# Patient Record
Sex: Female | Born: 1980 | Race: Black or African American | Hispanic: No | Marital: Married | Smoking: Never smoker
Health system: Southern US, Community
[De-identification: ages and names within clinical notes are randomized; demographics above are authoritative.]

## PROBLEM LIST (undated history)

## (undated) DIAGNOSIS — Z789 Other specified health status: Secondary | ICD-10-CM

## (undated) DIAGNOSIS — R87629 Unspecified abnormal cytological findings in specimens from vagina: Secondary | ICD-10-CM

## (undated) HISTORY — PX: NO PAST SURGERIES: SHX2092

## (undated) HISTORY — DX: Unspecified abnormal cytological findings in specimens from vagina: R87.629

---

## 2020-09-29 LAB — OB RESULTS CONSOLE GC/CHLAMYDIA: Chlamydia: NEGATIVE

## 2020-10-30 LAB — CBC AND DIFFERENTIAL
HCT: 42 (ref 36–46)
Hemoglobin: 12.9 (ref 12.0–16.0)
Platelets: 200 (ref 150–399)

## 2020-10-30 LAB — HEPATITIS B SURFACE ANTIGEN: Hepatitis B Surface Ag: NEGATIVE

## 2020-10-30 LAB — OB RESULTS CONSOLE GC/CHLAMYDIA: Gonorrhea: NEGATIVE

## 2020-10-30 LAB — OB RESULTS CONSOLE ABO/RH: RH Type: POSITIVE

## 2020-10-30 LAB — HM HEPATITIS C SCREENING LAB: HM Hepatitis Screen: NEGATIVE

## 2020-10-30 LAB — OB RESULTS CONSOLE ANTIBODY SCREEN: Antibody Screen: NEGATIVE

## 2020-10-30 LAB — HIV ANTIBODY (ROUTINE TESTING W REFLEX): HIV 1&2 Ab, 4th Generation: NONREACTIVE

## 2020-12-17 ENCOUNTER — Other Ambulatory Visit: Payer: Self-pay

## 2020-12-17 ENCOUNTER — Encounter (HOSPITAL_COMMUNITY): Payer: Self-pay | Admitting: Obstetrics and Gynecology

## 2020-12-17 ENCOUNTER — Inpatient Hospital Stay (HOSPITAL_COMMUNITY)
Admission: AD | Admit: 2020-12-17 | Discharge: 2020-12-17 | Disposition: A | Discharge status: Home / Self Care | Payer: Medicaid Other | Attending: Obstetrics and Gynecology | Admitting: Obstetrics and Gynecology

## 2020-12-17 DIAGNOSIS — O09512 Supervision of elderly primigravida, second trimester: Secondary | ICD-10-CM

## 2020-12-17 DIAGNOSIS — O368120 Decreased fetal movements, second trimester, not applicable or unspecified: Secondary | ICD-10-CM | POA: Diagnosis not present

## 2020-12-17 DIAGNOSIS — O09522 Supervision of elderly multigravida, second trimester: Secondary | ICD-10-CM | POA: Diagnosis not present

## 2020-12-17 DIAGNOSIS — Z3A22 22 weeks gestation of pregnancy: Secondary | ICD-10-CM | POA: Diagnosis not present

## 2020-12-17 HISTORY — DX: Other specified health status: Z78.9

## 2020-12-17 NOTE — Discharge Instructions (Signed)
Prenatal Care Providers           [facility]  [address] [phone]  [facility]   [address]  [phone]  [facility] [place]       [address] [phone]            [facility] @ [place]     [address] [phone]          [facility] [place]   [address] [phone]  [facility]  [address] [phone]     [facility]  [address]   [phone]     [facility]  Phone: [phone]  [facility]  Phone: [phone]  [facility] Phone: [phone]  [facility] Phone: [phone]  [facility] Phone: [phone]  [facility] Phone: [phone]  [facility]  Phone: [phone]  

## 2020-12-17 NOTE — MAU Note (Signed)
Has not felt the baby move at all today.  Was moving yesterday.  Usually if she pushes on her stomach, he will push back, not doing that today either. No pain, no bleeding or leaking.

## 2020-12-17 NOTE — MAU Provider Note (Signed)
Chief Complaint:  Decreased Fetal Movement   Event Date/Time   First Provider Initiated Contact with Patient 12/17/20 1525      HPI: Julie Montoya is a 40 y.o. G4P3003 at [redacted]w[redacted]d who presents to maternity admissions reporting she has not felt any fetal movement today. She usually feels movement every day but even when pushing on her belly or eating/drinking something, has not felt any movement today. She recently moved here from [place] and plans to establish care with [facility] but the office is waiting on records before making her appointment.   She denies any pain, vaginal bleeding, leaking fluid, or other concerns.   HPI  Past Medical History: Past Medical History:  Diagnosis Date  . Medical history non-contributory     Past obstetric history: OB History  Gravida Para Term Preterm AB Living  4 3 3     3   SAB IAB Ectopic Multiple Live Births          3    # Outcome Date GA Lbr Len/2nd Weight Sex Delivery Anes PTL Lv  4 Current           3 Term 03/19/06     Vag-Spont     2 Term 04/17/04     Vag-Spont     1 Term 03/14/03     Vag-Spont       Past Surgical History: Past Surgical History:  Procedure Laterality Date  . NO PAST SURGERIES      Family History: No family history on file.  Social History: Social History   Tobacco Use  . Smoking status: Never  . Smokeless tobacco: Never  Vaping Use  . Vaping Use: Never used  Substance Use Topics  . Alcohol use: Not Currently  . Drug use: Never    Allergies: No Known Allergies  Meds:  Medications Prior to Admission  Medication Sig Dispense Refill Last Dose  . Prenatal Vit-Fe Fumarate-FA (PRENATAL MULTIVITAMIN) TABS tablet Take 1 tablet by mouth daily at 12 noon.   12/16/2020    ROS:  Review of Systems  Constitutional:  Negative for chills, fatigue and fever.  Respiratory:  Negative for shortness of breath.   Cardiovascular:  Negative for chest pain.  Genitourinary:  Negative for difficulty urinating, dysuria, flank  pain, pelvic pain, vaginal bleeding, vaginal discharge and vaginal pain.  Neurological:  Negative for dizziness and headaches.  Psychiatric/Behavioral: Negative.      I have reviewed patient's Past Medical Hx, Surgical Hx, Family Hx, Social Hx, medications and allergies.   Physical Exam  Patient Vitals for the past 24 hrs:  BP Temp Temp src Pulse Resp SpO2 Height Weight  12/17/20 1454 116/69 -- -- 94 -- -- -- --  12/17/20 1438 115/67 98.6 F (37 C) Oral 95 18 100 % 5\' 7"  (1.702 m) 94.6 kg   Constitutional: Well-developed, well-nourished female in no acute distress.  Cardiovascular: normal rate Respiratory: normal effort GI: Abd soft, non-tender, gravid appropriate for gestational age.  MS: Extremities nontender, no edema, normal ROM Neurologic: Alert and oriented x 4.  GU: Neg CVAT.  PELVIC EXAM: Deferred     FHT:  154 by doppler   Labs: No results found for this or any previous visit (from the past 24 hour(s)).    Imaging:  No results found.  MAU Course/MDM: Orders Placed This Encounter  Procedures  . Discharge patient     No orders of the defined types were placed in this encounter.    FHT wnl Reassurance provided  to pt, discussed expected fetal movements based on gestational age Return to MAU for emergencies Records requested today in MAU, pt to follow up with [facility] to make appointment  Assessment: 1. Decreased fetal movements in second trimester, single or unspecified fetus   2. Primigravida of advanced maternal age in second trimester   3. [redacted] weeks gestation of pregnancy     Plan: Discharge home Labor precautions and fetal kick counts  Allergies as of 12/17/2020   No Known Allergies      Medication List     TAKE these medications    prenatal multivitamin Tabs tablet Take 1 tablet by mouth daily at 12 noon.        Sharen Counter Certified Nurse-Midwife 12/17/2020 3:37 PM

## 2021-01-11 DIAGNOSIS — O0990 Supervision of high risk pregnancy, unspecified, unspecified trimester: Secondary | ICD-10-CM | POA: Insufficient documentation

## 2021-01-13 ENCOUNTER — Ambulatory Visit (INDEPENDENT_AMBULATORY_CARE_PROVIDER_SITE_OTHER): Payer: Medicaid Other | Admitting: Obstetrics & Gynecology

## 2021-01-13 ENCOUNTER — Other Ambulatory Visit: Payer: Self-pay

## 2021-01-13 VITALS — BP 112/73 | HR 87 | Wt 210.0 lb

## 2021-01-13 DIAGNOSIS — O09523 Supervision of elderly multigravida, third trimester: Secondary | ICD-10-CM

## 2021-01-13 DIAGNOSIS — Z3480 Encounter for supervision of other normal pregnancy, unspecified trimester: Secondary | ICD-10-CM | POA: Diagnosis not present

## 2021-01-13 DIAGNOSIS — Z3A26 26 weeks gestation of pregnancy: Secondary | ICD-10-CM | POA: Diagnosis not present

## 2021-01-13 DIAGNOSIS — O0990 Supervision of high risk pregnancy, unspecified, unspecified trimester: Secondary | ICD-10-CM

## 2021-01-13 MED ORDER — GOJJI WEIGHT SCALE MISC: 1.0000 | 0 refills | Status: DC

## 2021-01-13 NOTE — Progress Notes (Signed)
  Subjective:Moved here from [place] on 11/01/2020    Julie Montoya is a [id number] [redacted]w[redacted]d being seen today for her first obstetrical visit.  Her obstetrical history is significant for advanced maternal age and lapse in [facility] . Patient does intend to breast feed. Pregnancy history fully reviewed.  Patient reports no complaints.  Vitals:   01/13/21 1531  BP: 112/73  Pulse: 87  Weight: 210 lb (95.3 kg)    HISTORY: OB History  Gravida Para Term Preterm AB Living  4 3 3     3   SAB IAB Ectopic Multiple Live Births          3    # Outcome Date GA Lbr Len/2nd Weight Sex Delivery Anes PTL Lv  4 Current           3 Term 03/19/06     Vag-Spont     2 Term 04/17/04     Vag-Spont     1 Term 03/14/03     Vag-Spont      Past Medical History:  Diagnosis Date   Medical history non-contributory    Past Surgical History:  Procedure Laterality Date   NO PAST SURGERIES     No family history on file.   Exam    Uterus:   27 cm  Pelvic Exam: deferred   Perineum:    Vulva:    Vagina:     pH:    Cervix:    Adnexa:    Bony Pelvis:   System: Breast:  normal appearance, no masses or tenderness   Skin: normal coloration and turgor, no rashes    Neurologic: oriented, normal mood   Extremities: normal strength, tone, and muscle mass   HEENT PERRLA and extra ocular movement intact   Mouth/Teeth    Neck supple   Cardiovascular: regular rate and rhythm   Respiratory:  appears well, vitals normal, no respiratory distress, acyanotic, normal RR   Abdomen: gravid   Urinary:       Assessment:    Pregnancy: 05/12/03 Patient Active Problem List   Diagnosis Date Noted   Supervision of high risk pregnancy, antepartum 01/11/2021    AMA    Plan:     Initial labs drawn.abstracted Prenatal vitamins. Problem list reviewed and updated. Genetic Screening discussed : results reviewed.  Ultrasound discussed; fetal survey: results reviewed.  Follow up in 2 weeks. 50% of 30 min visit spent on  counseling and coordination of care.  F/u growth scan  2 hr GTT  13/09/2020 01/13/2021

## 2021-01-13 NOTE — Progress Notes (Signed)
NOB Xfer, declined FLU vaccine. BP Cuff and Scale  sent to [facility].  Babyscripts sent.

## 2021-01-26 ENCOUNTER — Other Ambulatory Visit: Payer: Self-pay

## 2021-01-26 ENCOUNTER — Other Ambulatory Visit: Payer: Medicaid Other

## 2021-01-26 ENCOUNTER — Ambulatory Visit (INDEPENDENT_AMBULATORY_CARE_PROVIDER_SITE_OTHER): Payer: Medicaid Other | Admitting: Obstetrics & Gynecology

## 2021-01-26 ENCOUNTER — Encounter: Payer: Self-pay | Admitting: Obstetrics & Gynecology

## 2021-01-26 VITALS — BP 110/71 | HR 80 | Wt 215.0 lb

## 2021-01-26 DIAGNOSIS — O0993 Supervision of high risk pregnancy, unspecified, third trimester: Secondary | ICD-10-CM

## 2021-01-26 DIAGNOSIS — Z23 Encounter for immunization: Secondary | ICD-10-CM

## 2021-01-26 DIAGNOSIS — O0990 Supervision of high risk pregnancy, unspecified, unspecified trimester: Secondary | ICD-10-CM

## 2021-01-26 DIAGNOSIS — Z3A28 28 weeks gestation of pregnancy: Secondary | ICD-10-CM

## 2021-01-26 DIAGNOSIS — O09523 Supervision of elderly multigravida, third trimester: Secondary | ICD-10-CM | POA: Insufficient documentation

## 2021-01-26 NOTE — Progress Notes (Signed)
   PRENATAL VISIT NOTE  Subjective:  Julie Montoya is a 40 y.o. G4P3003 at [redacted]w[redacted]d being seen today for ongoing prenatal care.  She is currently monitored for the following issues for this high-risk pregnancy and has Supervision of high risk pregnancy, antepartum and AMA (advanced maternal age) multigravida 35+, third trimester on their problem list.  Patient reports no complaints.  Contractions: Not present. Vag. Bleeding: None.  Movement: Present. Denies leaking of fluid.   The following portions of the patient's history were reviewed and updated as appropriate: allergies, current medications, past family history, past medical history, past social history, past surgical history and problem list.   Objective:   Vitals:   01/26/21 0909  BP: 110/71  Pulse: 80  Weight: 215 lb (97.5 kg)    Fetal Status: Fetal Heart Rate (bpm): 143   Movement: Present     General:  Alert, oriented and cooperative. Patient is in no acute distress.  Skin: Skin is warm and dry. No rash noted.   Cardiovascular: Normal heart rate noted  Respiratory: Normal respiratory effort, no problems with respiration noted  Abdomen: Soft, gravid, appropriate for gestational age.  Pain/Pressure: Absent     Pelvic: Cervical exam deferred        Extremities: Normal range of motion.  Edema: None  Mental Status: Normal mood and affect. Normal behavior. Normal judgment and thought content.   Assessment and Plan:  Pregnancy: G4P3003 at [redacted]w[redacted]d 1. Supervision of high risk pregnancy, antepartum Routine GTT - Glucose Tolerance, 2 Hours w/1 Hour - CBC - RPR - HIV Antibody (routine testing w rflx) - Tdap vaccine greater than or equal to 7yo IM  2. [redacted] weeks gestation of pregnancy  - Glucose Tolerance, 2 Hours w/1 Hour - CBC - RPR - HIV Antibody (routine testing w rflx) - Tdap vaccine greater than or equal to 7yo IM  3. AMA (advanced maternal age) multigravida 35+, third trimester   Preterm labor symptoms and general  obstetric precautions including but not limited to vaginal bleeding, contractions, leaking of fluid and fetal movement were reviewed in detail with the patient. Please refer to After Visit Summary for other counseling recommendations.   Return in about 2 weeks (around 02/09/2021).  Future Appointments  Date Time Provider Department Center  01/26/2021  9:55 AM Adam Phenix, MD [facility] None  02/09/2021  7:30 AM [facility] NURSE [facility]  02/09/2021  7:45 AM [facility] US4 [facility]    Scheryl Darter, MD

## 2021-01-26 NOTE — Progress Notes (Signed)
+   Fetal movement. No complaints.  

## 2021-01-27 LAB — HIV ANTIBODY (ROUTINE TESTING W REFLEX): HIV Screen 4th Generation wRfx: NONREACTIVE

## 2021-01-27 LAB — GLUCOSE TOLERANCE, 2 HOURS W/ 1HR
Glucose, 1 hour: 197 mg/dL — ABNORMAL HIGH (ref 70–179)
Glucose, 2 hour: 135 mg/dL (ref 70–152)
Glucose, Fasting: 86 mg/dL (ref 70–91)

## 2021-01-27 LAB — CBC
Hematocrit: 36.9 % (ref 34.0–46.6)
Hemoglobin: 12.9 g/dL (ref 11.1–15.9)
MCH: 32.6 pg (ref 26.6–33.0)
MCHC: 35 g/dL (ref 31.5–35.7)
MCV: 93 fL (ref 79–97)
Platelets: 193 10*3/uL (ref 150–450)
RBC: 3.96 x10E6/uL (ref 3.77–5.28)
RDW: 12.1 % (ref 11.7–15.4)
WBC: 9 10*3/uL (ref 3.4–10.8)

## 2021-01-27 LAB — RPR: RPR Ser Ql: NONREACTIVE

## 2021-02-01 ENCOUNTER — Inpatient Hospital Stay (HOSPITAL_COMMUNITY)
Admission: AD | Admit: 2021-02-01 | Discharge: 2021-02-01 | Disposition: A | Discharge status: Home / Self Care | Payer: Medicaid Other | Attending: Obstetrics and Gynecology | Admitting: Obstetrics and Gynecology

## 2021-02-01 ENCOUNTER — Other Ambulatory Visit: Payer: Self-pay

## 2021-02-01 DIAGNOSIS — O09523 Supervision of elderly multigravida, third trimester: Secondary | ICD-10-CM | POA: Diagnosis not present

## 2021-02-01 DIAGNOSIS — O26893 Other specified pregnancy related conditions, third trimester: Secondary | ICD-10-CM | POA: Insufficient documentation

## 2021-02-01 DIAGNOSIS — Z3A29 29 weeks gestation of pregnancy: Secondary | ICD-10-CM | POA: Diagnosis not present

## 2021-02-01 DIAGNOSIS — R300 Dysuria: Secondary | ICD-10-CM | POA: Diagnosis present

## 2021-02-01 LAB — URINALYSIS, ROUTINE W REFLEX MICROSCOPIC
Bacteria, UA: NONE SEEN
Bilirubin Urine: NEGATIVE
Glucose, UA: NEGATIVE mg/dL
Hgb urine dipstick: NEGATIVE
Ketones, ur: NEGATIVE mg/dL
Leukocytes,Ua: NEGATIVE
Nitrite: NEGATIVE
Protein, ur: 30 mg/dL — AB
Specific Gravity, Urine: 1.028 (ref 1.005–1.030)
pH: 5 (ref 5.0–8.0)

## 2021-02-01 NOTE — MAU Note (Signed)
Presents with pain and stinging with urination.  Denies frequency & urgency.   Endorses +FM.  Denies VB or LOF.

## 2021-02-01 NOTE — MAU Provider Note (Signed)
Event Date/Time   First Provider Initiated Contact with Patient 02/01/21 1545      S Ms. Julie Montoya is a 40 y.o. [phone] patient who presents to MAU today with complaint of dysuria that started yesterday that has been staying about the same. No decreased fetal movement, nausea, vomiting, fevers, chills. Denies flank pain.   O BP (!) 106/58 (BP Location: Right Arm)   Pulse 83   Temp 97.9 F (36.6 C) (Oral)   Resp 19   Ht 5\' 7"  (1.702 m)   Wt 98.2 kg   LMP 07/12/2020 (Exact Date)   SpO2 99%   BMI 33.92 kg/m  Physical Exam Constitutional:      Appearance: Normal appearance.  Cardiovascular:     Rate and Rhythm: Normal rate and regular rhythm.     Pulses: Normal pulses.     Heart sounds: Normal heart sounds.  Pulmonary:     Effort: Pulmonary effort is normal.     Breath sounds: Normal breath sounds.  Abdominal:     General: Abdomen is flat. There is no distension.     Palpations: Abdomen is soft.     Tenderness: There is no abdominal tenderness. There is no right CVA tenderness or left CVA tenderness.  Skin:    General: Skin is warm and dry.     Capillary Refill: Capillary refill takes less than 2 seconds.  Neurological:     General: No focal deficit present.     Mental Status: She is alert.  Psychiatric:        Mood and Affect: Mood normal.        Behavior: Behavior normal.        Thought Content: Thought content normal.        Judgment: Judgment normal.   Results for orders placed or performed during the hospital encounter of 02/01/21 (from the past 24 hour(s))  Urinalysis, Routine w reflex microscopic Urine, Clean Catch     Status: Abnormal   Collection Time: 02/01/21  3:45 PM  Result Value Ref Range   Color, Urine YELLOW YELLOW   APPearance HAZY (A) CLEAR   Specific Gravity, Urine 1.028 1.005 - 1.030   pH 5.0 5.0 - 8.0   Glucose, UA NEGATIVE NEGATIVE mg/dL   Hgb urine dipstick NEGATIVE NEGATIVE   Bilirubin Urine NEGATIVE NEGATIVE   Ketones, ur NEGATIVE  NEGATIVE mg/dL   Protein, ur 30 (A) NEGATIVE mg/dL   Nitrite NEGATIVE NEGATIVE   Leukocytes,Ua NEGATIVE NEGATIVE   RBC / HPF 0-5 0 - 5 RBC/hpf   WBC, UA 0-5 0 - 5 WBC/hpf   Bacteria, UA NONE SEEN NONE SEEN   Squamous Epithelial / LPF 0-5 0 - 5   Mucus PRESENT    Ca Oxalate Crys, UA PRESENT      A Medical screening exam complete 1. [redacted] weeks gestation of pregnancy   2. Dysuria      P Discharge from MAU in stable condition Send urine for culture.  Encourage fluids, cranberry juice, Azo. Patient may return to MAU as needed   02/03/21, DO 02/01/2021 4:46 PM

## 2021-02-02 ENCOUNTER — Other Ambulatory Visit: Payer: Self-pay

## 2021-02-02 DIAGNOSIS — O24419 Gestational diabetes mellitus in pregnancy, unspecified control: Secondary | ICD-10-CM

## 2021-02-02 LAB — URINE CULTURE: Culture: 60000 — AB

## 2021-02-02 MED ORDER — ACCU-CHEK SOFTCLIX LANCETS MISC: 12 refills | Status: DC

## 2021-02-02 MED ORDER — ACCU-CHEK GUIDE VI STRP: ORAL_STRIP | 12 refills | Status: DC

## 2021-02-02 MED ORDER — ACCU-CHEK GUIDE W/DEVICE KIT: 1.0000 | PACK | Freq: Four times a day (QID) | 0 refills | Status: DC

## 2021-02-03 ENCOUNTER — Encounter: Payer: Medicaid Other | Attending: Obstetrics & Gynecology | Admitting: Registered"

## 2021-02-03 ENCOUNTER — Other Ambulatory Visit: Payer: Self-pay

## 2021-02-03 ENCOUNTER — Encounter: Payer: Self-pay | Admitting: Registered"

## 2021-02-03 DIAGNOSIS — O24419 Gestational diabetes mellitus in pregnancy, unspecified control: Secondary | ICD-10-CM | POA: Insufficient documentation

## 2021-02-03 NOTE — Progress Notes (Signed)
Patient was seen on 02/03/21 for Gestational Diabetes self-management class at [facility]. The following learning objectives were met by the patient during this course:  States the definition of Gestational Diabetes States why dietary management is important in controlling blood glucose Describes the effects each nutrient has on blood glucose levels Demonstrates ability to create a balanced meal plan Demonstrates carbohydrate counting  States when to check blood glucose levels Demonstrates proper blood glucose monitoring techniques States the effect of stress and exercise on blood glucose levels States the importance of limiting caffeine and abstaining from alcohol and smoking  Blood glucose monitor given: Accu-chek Guide Me Lot #[id number] Exp: 04/16/2022 CBG: 93 mg/dL   Patient instructed to monitor glucose levels: FBS: 60 - <95; 1 hour: <140; 2 hour: <120  Patient received handouts: Nutrition Diabetes and Pregnancy, including carb counting list  Patient will be seen for follow-up as needed.

## 2021-02-05 ENCOUNTER — Ambulatory Visit: Payer: Medicaid Other

## 2021-02-09 ENCOUNTER — Other Ambulatory Visit: Payer: Self-pay | Admitting: *Deleted

## 2021-02-09 ENCOUNTER — Encounter: Payer: Self-pay | Admitting: *Deleted

## 2021-02-09 ENCOUNTER — Ambulatory Visit: Payer: Medicaid Other | Attending: Obstetrics & Gynecology

## 2021-02-09 ENCOUNTER — Ambulatory Visit (HOSPITAL_BASED_OUTPATIENT_CLINIC_OR_DEPARTMENT_OTHER): Payer: Medicaid Other | Admitting: Obstetrics and Gynecology

## 2021-02-09 ENCOUNTER — Other Ambulatory Visit: Payer: Self-pay

## 2021-02-09 ENCOUNTER — Ambulatory Visit: Payer: Medicaid Other | Admitting: *Deleted

## 2021-02-09 VITALS — BP 101/62 | HR 79

## 2021-02-09 DIAGNOSIS — Z3A30 30 weeks gestation of pregnancy: Secondary | ICD-10-CM

## 2021-02-09 DIAGNOSIS — O09523 Supervision of elderly multigravida, third trimester: Secondary | ICD-10-CM | POA: Diagnosis present

## 2021-02-09 DIAGNOSIS — O0933 Supervision of pregnancy with insufficient antenatal care, third trimester: Secondary | ICD-10-CM

## 2021-02-09 DIAGNOSIS — O24419 Gestational diabetes mellitus in pregnancy, unspecified control: Secondary | ICD-10-CM

## 2021-02-09 DIAGNOSIS — O0990 Supervision of high risk pregnancy, unspecified, unspecified trimester: Secondary | ICD-10-CM | POA: Diagnosis present

## 2021-02-09 NOTE — Progress Notes (Signed)
Maternal-Fetal Medicine   Name: Julie Montoya DOB: 10-Feb-1981 MRN: [id number] Referring Provider: Scheryl Darter, MD  I had the pleasure of seeing [name] [facility] at [facility]. G4 P3003 at 30w 2d gestation and is here for ultrasound evaluation. She recently moved from [place], [place] where she initiated her prenatal care.    Advanced maternal age.  On cell free fetal DNA screening, the risks of fetal aneuploidies are not increased.  Patient has not had fetal anatomical survey ultrasound.  She has a recent diagnosis of gestational diabetes.  Patient had consultation with our diabetic educator.  She is yet to check her blood glucose regularly.  Obstetric history significant for 3 term vaginal deliveries.  She reports no chronic medical conditions.  Ultrasound Fetal growth is appropriate for gestational age.  Amniotic fluid is normal good fetal activity seen.  No obvious fetal structural defects are seen.  Fetal anatomical survey appears normal but limited by advanced gestational age.  Gestational diabetes I explained the diagnosis of gestational diabetes.  I emphasized the importance of good blood glucose control to prevent adverse fetal or neonatal outcomes.  I discussed blood glucose normal values. I encouraged her to check her blood glucose regularly. Possible complications of gestational diabetes include fetal macrosomia, shoulder dystocia and birth injuries, stillbirth (in poorly controlled diabetes) and neonatal respiratory syndrome and other complications.  In about 85% of cases, gestational diabetes is well controlled by diet alone.  Exercise reduces the need for insulin.  Medical treatment includes oral hypoglycemics or insulin. If patient requires insulin or oral hypoglycemics, we recommend weekly antenatal testing from 32 weeks' gestation till delivery. Because of the possibility of suboptimal control, I recommend weekly BPP from 32 weeks' gestation  till delivery.  Timing of delivery: In well-controlled diabetes on diet, patient can be delivered at 59- or 40-weeks' gestation. Vagal delivery is not contraindicated. Type 2 diabetes develops in about 25% to 40% of women with GDM. I recommend postpartum screening with 75-g glucose load at 6 to 12 weeks after delivery.  Recommendations -An appointment was made for her to return in 2 weeks for BPP. -Weekly BPP from [redacted] weeks gestation till delivery. -Postpartum screening for type 2 diabetes.  Thank you for consultation.  If you have any questions or concerns, please contact me the [facility].  Consultation including face-to-face (more than 50%) counseling 30 minutes.

## 2021-02-12 ENCOUNTER — Other Ambulatory Visit: Payer: Self-pay

## 2021-02-12 ENCOUNTER — Ambulatory Visit (INDEPENDENT_AMBULATORY_CARE_PROVIDER_SITE_OTHER): Payer: Medicaid Other | Admitting: Obstetrics

## 2021-02-12 ENCOUNTER — Encounter: Payer: Self-pay | Admitting: Obstetrics

## 2021-02-12 VITALS — BP 109/71 | HR 84 | Wt 212.0 lb

## 2021-02-12 DIAGNOSIS — O0990 Supervision of high risk pregnancy, unspecified, unspecified trimester: Secondary | ICD-10-CM

## 2021-02-12 DIAGNOSIS — O2243 Hemorrhoids in pregnancy, third trimester: Secondary | ICD-10-CM

## 2021-02-12 DIAGNOSIS — K5901 Slow transit constipation: Secondary | ICD-10-CM

## 2021-02-12 MED ORDER — HYDROCORTISONE ACETATE 25 MG RE SUPP: 25.0000 mg | Freq: Two times a day (BID) | RECTAL | 11 refills | Status: DC

## 2021-02-12 MED ORDER — DOCUSATE SODIUM 100 MG PO CAPS: 100.0000 mg | ORAL_CAPSULE | Freq: Two times a day (BID) | ORAL | 11 refills | Status: DC

## 2021-02-12 NOTE — Progress Notes (Signed)
Pt having problems with constipation and now has hemorrhoids.

## 2021-02-12 NOTE — Progress Notes (Signed)
Subjective:  Julie Montoya is a 40 y.o. G4P3003 at [redacted]w[redacted]d being seen today for ongoing prenatal care.  She is currently monitored for the following issues for this high-risk pregnancy and has Supervision of high risk pregnancy, antepartum; AMA (advanced maternal age) multigravida 35+, third trimester; and Gestational diabetes mellitus (GDM), antepartum on their problem list.  Patient reports  constipation and hemorrhoids .  Contractions: Not present. Vag. Bleeding: None.  Movement: Present. Denies leaking of fluid.   The following portions of the patient's history were reviewed and updated as appropriate: allergies, current medications, past family history, past medical history, past social history, past surgical history and problem list. Problem list updated.  Objective:   Vitals:   02/12/21 0852  BP: 109/71  Pulse: 84  Weight: 212 lb (96.2 kg)    Fetal Status:     Movement: Present     General:  Alert, oriented and cooperative. Patient is in no acute distress.  Skin: Skin is warm and dry. No rash noted.   Cardiovascular: Normal heart rate noted  Respiratory: Normal respiratory effort, no problems with respiration noted  Abdomen: Soft, gravid, appropriate for gestational age. Pain/Pressure: Absent     Pelvic:  Cervical exam deferred        Extremities: Normal range of motion.     Mental Status: Normal mood and affect. Normal behavior. Normal judgment and thought content.   Urinalysis:      Assessment and Plan:  Pregnancy: G4P3003 at [redacted]w[redacted]d  1. Supervision of high risk pregnancy, antepartum  2. Hemorrhoids during pregnancy in third trimester Rx: - hydrocortisone (ANUSOL-HC) 25 MG suppository; Place 1 suppository (25 mg total) rectally 2 (two) times daily.  Dispense: 24 suppository; Refill: 11  3. Constipation by delayed colonic transit Rx: - docusate sodium (COLACE) 100 MG capsule; Take 1 capsule (100 mg total) by mouth 2 (two) times daily.  Dispense: 60 capsule; Refill:  11    Preterm labor symptoms and general obstetric precautions including but not limited to vaginal bleeding, contractions, leaking of fluid and fetal movement were reviewed in detail with the patient. Please refer to After Visit Summary for other counseling recommendations.   Return in about 2 weeks (around 02/26/2021) for ROB.   Brock Bad, MD  02/12/21

## 2021-02-23 ENCOUNTER — Ambulatory Visit (INDEPENDENT_AMBULATORY_CARE_PROVIDER_SITE_OTHER): Payer: Medicaid Other | Admitting: Obstetrics and Gynecology

## 2021-02-23 ENCOUNTER — Other Ambulatory Visit: Payer: Self-pay

## 2021-02-23 VITALS — BP 107/70 | HR 78 | Wt 220.7 lb

## 2021-02-23 DIAGNOSIS — Z3A32 32 weeks gestation of pregnancy: Secondary | ICD-10-CM | POA: Insufficient documentation

## 2021-02-23 DIAGNOSIS — O0990 Supervision of high risk pregnancy, unspecified, unspecified trimester: Secondary | ICD-10-CM

## 2021-02-23 DIAGNOSIS — O09523 Supervision of elderly multigravida, third trimester: Secondary | ICD-10-CM

## 2021-02-23 DIAGNOSIS — O24410 Gestational diabetes mellitus in pregnancy, diet controlled: Secondary | ICD-10-CM

## 2021-02-23 NOTE — Progress Notes (Signed)
° °  PRENATAL VISIT NOTE  Subjective:  Julie Montoya is a 40 y.o. G4P3003 at [redacted]w[redacted]d being seen today for ongoing prenatal care.  She is currently monitored for the following issues for this high-risk pregnancy and has Supervision of high risk pregnancy, antepartum; AMA (advanced maternal age) multigravida 35+, third trimester; Gestational diabetes mellitus (GDM), antepartum; and [redacted] weeks gestation of pregnancy on their problem list.  Patient doing well with no acute concerns today. She reports no complaints.  Contractions: Not present. Vag. Bleeding: None.  Movement: Present. Denies leaking of fluid.   The following portions of the patient's history were reviewed and updated as appropriate: allergies, current medications, past family history, past medical history, past social history, past surgical history and problem list. Problem list updated.  Objective:   Vitals:   02/23/21 1546  BP: 107/70  Pulse: 78  Weight: 220 lb 11.2 oz (100.1 kg)    Fetal Status: Fetal Heart Rate (bpm): 154 Fundal Height: 33 cm Movement: Present     General:  Alert, oriented and cooperative. Patient is in no acute distress.  Skin: Skin is warm and dry. No rash noted.   Cardiovascular: Normal heart rate noted  Respiratory: Normal respiratory effort, no problems with respiration noted  Abdomen: Soft, gravid, appropriate for gestational age.  Pain/Pressure: Absent     Pelvic: Cervical exam deferred        Extremities: Normal range of motion.     Mental Status:  Normal mood and affect. Normal behavior. Normal judgment and thought content.   Assessment and Plan:  Pregnancy: G4P3003 at [redacted]w[redacted]d  1. [redacted] weeks gestation of pregnancy   2. Diet controlled gestational diabetes mellitus (GDM), antepartum Continue GDM diet  3. Supervision of high risk pregnancy, antepartum Continue routine prenatal care Discussed postpartum contraception, pt leaning toward depo provera, but is considering nexplanon or IUD  4. AMA  (advanced maternal age) multigravida 35+, third trimester   Preterm labor symptoms and general obstetric precautions including but not limited to vaginal bleeding, contractions, leaking of fluid and fetal movement were reviewed in detail with the patient.  Please refer to After Visit Summary for other counseling recommendations.   Return in about 2 weeks (around 03/09/2021) for [facility], in person.   Mariel Aloe, MD Faculty Attending [facility] for [facility]

## 2021-03-07 NOTE — L&D Delivery Note (Signed)
LABOR COURSE   Delivery Note Called to room and patient was complete and pushing. Head delivered [phone]. No nuchal cord present. Shoulder and body delivered in usual fashion. At [phone] a viable female was delivered via Vaginal, Spontaneous (Presentation: ROA). Infant with spontaneous cry, placed on mother's abdomen, dried and stimulated. Cord clamped x 2 after 1-minute delay, and cut by father, Cedric. Cord blood drawn. Placenta delivered manually with gentle cord traction. Appears intact. Fundus firm with massage and Pitocin. Labia, perineum, vagina, and cervix inspected with 4x4, and intact.    APGAR: 8,9 ; weight: pending see delivery summary Cord: 3VC with the following complications: None     Anesthesia: Epidural Episiotomy: None Lacerations: None Suture Repair:  N/A Est. Blood Loss (mL): 600  Mom to postpartum.  Baby boy Camari to Couplet care / Skin to Skin.  Jeananne Rama, SNM 7:22 AM

## 2021-03-09 ENCOUNTER — Ambulatory Visit (INDEPENDENT_AMBULATORY_CARE_PROVIDER_SITE_OTHER): Payer: Medicaid Other | Admitting: Family Medicine

## 2021-03-09 ENCOUNTER — Other Ambulatory Visit: Payer: Self-pay

## 2021-03-09 VITALS — BP 110/71 | HR 80 | Wt 213.0 lb

## 2021-03-09 DIAGNOSIS — O0990 Supervision of high risk pregnancy, unspecified, unspecified trimester: Secondary | ICD-10-CM

## 2021-03-09 DIAGNOSIS — O09523 Supervision of elderly multigravida, third trimester: Secondary | ICD-10-CM

## 2021-03-09 DIAGNOSIS — O24410 Gestational diabetes mellitus in pregnancy, diet controlled: Secondary | ICD-10-CM

## 2021-03-09 MED ORDER — METFORMIN HCL 500 MG PO TABS: 500.0000 mg | ORAL_TABLET | Freq: Two times a day (BID) | ORAL | 2 refills | Status: DC

## 2021-03-09 NOTE — Progress Notes (Signed)
Pt states her sugars are doing well at home.

## 2021-03-09 NOTE — Progress Notes (Signed)
° °  PRENATAL VISIT NOTE  Subjective:  Julie Montoya is a 41 y.o. G4P3003 at [redacted]w[redacted]d being seen today for ongoing prenatal care.  She is currently monitored for the following issues for this high-risk pregnancy and has Supervision of high risk pregnancy, antepartum; AMA (advanced maternal age) multigravida 35+, third trimester; and Gestational diabetes mellitus (GDM), antepartum on their problem list.  Patient reports no complaints.  Contractions: Not present. Vag. Bleeding: None.  Movement: Present. Denies leaking of fluid.   The following portions of the patient's history were reviewed and updated as appropriate: allergies, current medications, past family history, past medical history, past social history, past surgical history and problem list.   Objective:   Vitals:   03/09/21 1338  BP: 110/71  Pulse: 80  Weight: 213 lb (96.6 kg)    Fetal Status: Fetal Heart Rate (bpm): 150 Fundal Height: 33 cm Movement: Present     General:  Alert, oriented and cooperative. Patient is in no acute distress.  Skin: Skin is warm and dry. No rash noted.   Cardiovascular: Normal heart rate noted  Respiratory: Normal respiratory effort, no problems with respiration noted  Abdomen: Soft, gravid, appropriate for gestational age.  Pain/Pressure: Absent     Pelvic: Cervical exam performed in the presence of a chaperone        Extremities: Normal range of motion.     Mental Status: Normal mood and affect. Normal behavior. Normal judgment and thought content.   Assessment and Plan:  Pregnancy: G4P3003 at [redacted]w[redacted]d 1. Supervision of high risk pregnancy, antepartum   2. Diet controlled gestational diabetes mellitus (GDM), antepartum Reports CBGs are always higher than 120 No log book today fastings are all > 90 Has tried smaller portions. In antenatal testing tomorrow Begin Metformin - metFORMIN (GLUCOPHAGE) 500 MG tablet; Take 1 tablet (500 mg total) by mouth 2 (two) times daily with a meal.  Dispense:  60 tablet; Refill: 2  3. AMA (advanced maternal age) multigravida 64+, third trimester LR NIPT  Preterm labor symptoms and general obstetric precautions including but not limited to vaginal bleeding, contractions, leaking of fluid and fetal movement were reviewed in detail with the patient. Please refer to After Visit Summary for other counseling recommendations.   Return in 2 weeks (on 03/23/2021).  Future Appointments  Date Time Provider [place]  03/10/2021  2:30 PM [facility] NURSE [facility]  03/10/2021  2:45 PM [facility] US5 [facility]  03/16/2021  2:30 PM [facility] NURSE [facility]  03/16/2021  2:45 PM [facility]  03/24/2021  8:35 AM Genia Del, MD [facility] None  03/24/2021  2:45 PM [facility] NURSE [facility]  03/24/2021  3:00 PM [facility] US1 [facility]    Donnamae Jude, MD

## 2021-03-10 ENCOUNTER — Ambulatory Visit: Payer: Medicaid Other | Attending: Obstetrics and Gynecology

## 2021-03-10 ENCOUNTER — Ambulatory Visit: Payer: Medicaid Other | Admitting: *Deleted

## 2021-03-10 ENCOUNTER — Other Ambulatory Visit: Payer: Self-pay | Admitting: *Deleted

## 2021-03-10 VITALS — BP 109/71 | HR 78

## 2021-03-10 DIAGNOSIS — O0933 Supervision of pregnancy with insufficient antenatal care, third trimester: Secondary | ICD-10-CM

## 2021-03-10 DIAGNOSIS — O09523 Supervision of elderly multigravida, third trimester: Secondary | ICD-10-CM

## 2021-03-10 DIAGNOSIS — O24415 Gestational diabetes mellitus in pregnancy, controlled by oral hypoglycemic drugs: Secondary | ICD-10-CM

## 2021-03-10 DIAGNOSIS — Z3A34 34 weeks gestation of pregnancy: Secondary | ICD-10-CM

## 2021-03-10 DIAGNOSIS — O24419 Gestational diabetes mellitus in pregnancy, unspecified control: Secondary | ICD-10-CM | POA: Diagnosis present

## 2021-03-16 ENCOUNTER — Ambulatory Visit: Payer: Medicaid Other | Attending: Obstetrics and Gynecology

## 2021-03-16 ENCOUNTER — Other Ambulatory Visit: Payer: Self-pay

## 2021-03-16 ENCOUNTER — Ambulatory Visit: Payer: Medicaid Other | Admitting: *Deleted

## 2021-03-16 VITALS — BP 107/70 | HR 78

## 2021-03-16 DIAGNOSIS — O24419 Gestational diabetes mellitus in pregnancy, unspecified control: Secondary | ICD-10-CM | POA: Insufficient documentation

## 2021-03-16 DIAGNOSIS — O09523 Supervision of elderly multigravida, third trimester: Secondary | ICD-10-CM

## 2021-03-16 DIAGNOSIS — O0933 Supervision of pregnancy with insufficient antenatal care, third trimester: Secondary | ICD-10-CM | POA: Diagnosis not present

## 2021-03-16 DIAGNOSIS — Z3A35 35 weeks gestation of pregnancy: Secondary | ICD-10-CM

## 2021-03-23 ENCOUNTER — Ambulatory Visit: Payer: Medicaid Other

## 2021-03-24 ENCOUNTER — Ambulatory Visit: Payer: Medicaid Other | Attending: Obstetrics and Gynecology

## 2021-03-24 ENCOUNTER — Encounter: Payer: Self-pay | Admitting: *Deleted

## 2021-03-24 ENCOUNTER — Ambulatory Visit: Payer: Medicaid Other | Admitting: *Deleted

## 2021-03-24 ENCOUNTER — Other Ambulatory Visit (HOSPITAL_COMMUNITY)
Admission: RE | Admit: 2021-03-24 | Discharge: 2021-03-24 | Disposition: A | Discharge status: Home / Self Care | Payer: Medicaid Other | Source: Ambulatory Visit | Attending: Family Medicine | Admitting: Family Medicine

## 2021-03-24 ENCOUNTER — Other Ambulatory Visit: Payer: Self-pay

## 2021-03-24 ENCOUNTER — Ambulatory Visit (INDEPENDENT_AMBULATORY_CARE_PROVIDER_SITE_OTHER): Payer: Medicaid Other | Admitting: Family Medicine

## 2021-03-24 ENCOUNTER — Encounter: Payer: Self-pay | Admitting: Family Medicine

## 2021-03-24 VITALS — BP 108/70 | HR 78

## 2021-03-24 VITALS — BP 112/66 | HR 77 | Wt 215.0 lb

## 2021-03-24 DIAGNOSIS — O24419 Gestational diabetes mellitus in pregnancy, unspecified control: Secondary | ICD-10-CM | POA: Diagnosis present

## 2021-03-24 DIAGNOSIS — O0990 Supervision of high risk pregnancy, unspecified, unspecified trimester: Secondary | ICD-10-CM

## 2021-03-24 DIAGNOSIS — Z3A36 36 weeks gestation of pregnancy: Secondary | ICD-10-CM

## 2021-03-24 DIAGNOSIS — O0933 Supervision of pregnancy with insufficient antenatal care, third trimester: Secondary | ICD-10-CM | POA: Diagnosis not present

## 2021-03-24 DIAGNOSIS — O09523 Supervision of elderly multigravida, third trimester: Secondary | ICD-10-CM

## 2021-03-24 DIAGNOSIS — O24415 Gestational diabetes mellitus in pregnancy, controlled by oral hypoglycemic drugs: Secondary | ICD-10-CM

## 2021-03-24 NOTE — Progress Notes (Signed)
° °  PRENATAL VISIT NOTE  Subjective:  Julie Montoya is a 41 y.o. G4P3003 at [redacted]w[redacted]d being seen today for ongoing prenatal care.  She is currently monitored for the following issues for this high-risk pregnancy and has Supervision of high risk pregnancy, antepartum; AMA (advanced maternal age) multigravida 35+, third trimester; and Gestational diabetes mellitus (GDM), antepartum on their problem list.  Patient reports no complaints.  Contractions: Not present. Vag. Bleeding: None.  Movement: Present. Denies leaking of fluid.   The following portions of the patient's history were reviewed and updated as appropriate: allergies, current medications, past family history, past medical history, past social history, past surgical history and problem list.   Objective:   Vitals:   03/24/21 0900  BP: 112/66  Pulse: 77  Weight: 215 lb (97.5 kg)    Fetal Status: Fetal Heart Rate (bpm): 154 Fundal Height: 37 cm Movement: Present  General:  Alert, oriented and cooperative. Patient is in no acute distress.  Skin: Skin is warm and dry. No rash noted.   Cardiovascular: Normal heart rate noted.  Respiratory: Normal respiratory effort, no problems with respiration noted.  Abdomen: Soft, gravid, appropriate for gestational age.  Pain/Pressure: Absent.     Pelvic: Cervical exam deferred. Swabs obtained with chaperone present.   Extremities: Normal range of motion.  Edema: None.  Mental Status: Normal mood and affect. Normal behavior. Normal judgment and thought content.   Assessment and Plan:  Pregnancy: G4P3003 at [redacted]w[redacted]d  1. Supervision of high risk pregnancy, antepartum 2. [redacted] weeks gestation of pregnancy Progressing well. FH and FHT within normal limits. GBS and vaginal swab obtained today, will follow up results. Next visit in 1 week.  - Cervicovaginal ancillary only( [place]) - Culture, beta strep (group b only)  3. Gestational diabetes mellitus (GDM) controlled on oral hypoglycemic drug,  antepartum Fasting CBGs now at goal. Post-prandial CBGs still between 120-200. Taking Metformin once daily. Recommended increase to twice daily. Counseled on dietary changes to support low carbohydrate diet. Will reassess at next visit. Continue antenatal testing with MFM. Last growth 94% EFW.   4. AMA (advanced maternal age) multigravida 61+, third trimester LR NIPS.   Preterm labor symptoms and general obstetric precautions including but not limited to vaginal bleeding, contractions, leaking of fluid and fetal movement were reviewed in detail with the patient.  Please refer to After Visit Summary for other counseling recommendations.   Return in about 1 week (around 03/31/2021) for follow up HR OB visit.  Future Appointments  Date Time Provider [place]  03/24/2021  2:45 PM [facility] NURSE [facility]  03/24/2021  3:00 PM [facility] US1 [facility]  04/02/2021 12:30 PM [facility] NURSE [facility]  04/02/2021 12:45 PM [facility] US6 [facility]  04/07/2021 12:30 PM [facility] NURSE [facility]  04/07/2021 12:45 PM [facility] US4 [facility]   Margreta Journey Barrie Lyme, MD

## 2021-03-24 NOTE — Progress Notes (Signed)
ROB/GBS. BS fasting 82 and 2 hrs after 126-197. C/o suppository not working.

## 2021-03-25 LAB — CERVICOVAGINAL ANCILLARY ONLY
Chlamydia: NEGATIVE
Comment: NEGATIVE
Comment: NEGATIVE
Comment: NORMAL
Neisseria Gonorrhea: NEGATIVE
Trichomonas: NEGATIVE

## 2021-03-28 LAB — CULTURE, BETA STREP (GROUP B ONLY): Strep Gp B Culture: NEGATIVE

## 2021-04-02 ENCOUNTER — Other Ambulatory Visit: Payer: Self-pay | Admitting: Advanced Practice Midwife

## 2021-04-02 ENCOUNTER — Other Ambulatory Visit: Payer: Self-pay

## 2021-04-02 ENCOUNTER — Ambulatory Visit: Payer: Medicaid Other | Admitting: *Deleted

## 2021-04-02 ENCOUNTER — Encounter (HOSPITAL_COMMUNITY): Payer: Self-pay

## 2021-04-02 ENCOUNTER — Encounter (HOSPITAL_COMMUNITY): Payer: Self-pay | Admitting: *Deleted

## 2021-04-02 ENCOUNTER — Telehealth (HOSPITAL_COMMUNITY): Payer: Self-pay | Admitting: *Deleted

## 2021-04-02 ENCOUNTER — Ambulatory Visit (INDEPENDENT_AMBULATORY_CARE_PROVIDER_SITE_OTHER): Payer: Medicaid Other | Admitting: Obstetrics & Gynecology

## 2021-04-02 ENCOUNTER — Ambulatory Visit: Payer: Medicaid Other | Attending: Obstetrics

## 2021-04-02 VITALS — BP 114/75 | HR 71 | Wt 223.0 lb

## 2021-04-02 VITALS — BP 112/62 | HR 75

## 2021-04-02 DIAGNOSIS — Z3A37 37 weeks gestation of pregnancy: Secondary | ICD-10-CM

## 2021-04-02 DIAGNOSIS — O0933 Supervision of pregnancy with insufficient antenatal care, third trimester: Secondary | ICD-10-CM | POA: Insufficient documentation

## 2021-04-02 DIAGNOSIS — O09523 Supervision of elderly multigravida, third trimester: Secondary | ICD-10-CM | POA: Diagnosis present

## 2021-04-02 DIAGNOSIS — O24415 Gestational diabetes mellitus in pregnancy, controlled by oral hypoglycemic drugs: Secondary | ICD-10-CM | POA: Diagnosis present

## 2021-04-02 DIAGNOSIS — O0990 Supervision of high risk pregnancy, unspecified, unspecified trimester: Secondary | ICD-10-CM

## 2021-04-02 NOTE — Telephone Encounter (Signed)
Preadmission screen  

## 2021-04-02 NOTE — Progress Notes (Signed)
° °  PRENATAL VISIT NOTE  Subjective:  Julie Montoya is a 41 y.o. G4P3003 at [redacted]w[redacted]d being seen today for ongoing prenatal care.  She is currently monitored for the following issues for this high-risk pregnancy and has Supervision of high risk pregnancy, antepartum; AMA (advanced maternal age) multigravida 35+, third trimester; and Gestational diabetes mellitus (GDM), antepartum on their problem list.  Patient reports no complaints.  Contractions: Not present. Vag. Bleeding: None.  Movement: Present. Denies leaking of fluid.   The following portions of the patient's history were reviewed and updated as appropriate: allergies, current medications, past family history, past medical history, past social history, past surgical history and problem list.   Objective:   Vitals:   04/02/21 0944  BP: 114/75  Pulse: 71  Weight: 223 lb (101.2 kg)    Fetal Status: Fetal Heart Rate (bpm): 145   Movement: Present     General:  Alert, oriented and cooperative. Patient is in no acute distress.  Skin: Skin is warm and dry. No rash noted.   Cardiovascular: Normal heart rate noted  Respiratory: Normal respiratory effort, no problems with respiration noted  Abdomen: Soft, gravid, appropriate for gestational age.  Pain/Pressure: Absent     Pelvic: Cervical exam deferred        Extremities: Normal range of motion.  Edema: Trace  Mental Status: Normal mood and affect. Normal behavior. Normal judgment and thought content.   Assessment and Plan:  Pregnancy: G4P3003 at [redacted]w[redacted]d 1. Gestational diabetes mellitus (GDM) controlled on oral hypoglycemic drug, antepartum Good control on metformin 39 week IOL 2. Supervision of high risk pregnancy, antepartum Weekly BPP  3. AMA (advanced maternal age) multigravida 35+, third trimester 41 y.o.   Term labor symptoms and general obstetric precautions including but not limited to vaginal bleeding, contractions, leaking of fluid and fetal movement were reviewed in  detail with the patient. Please refer to After Visit Summary for other counseling recommendations.   Return in about 1 week (around 04/09/2021).  Future Appointments  Date Time Provider Department Center  04/02/2021 12:30 PM [facility] NURSE [facility]  04/02/2021 12:45 PM [facility] US[facility]  04/07/2021 12:30 PM [facility] NURSE [facility]  04/07/2021 12:45 PM [facility] US4 [facility]  04/09/2021 11:15 AM Hermina Staggers, MD [facility] None    Scheryl Darter, MD

## 2021-04-07 ENCOUNTER — Other Ambulatory Visit: Payer: Self-pay

## 2021-04-07 ENCOUNTER — Other Ambulatory Visit: Payer: Self-pay | Admitting: Advanced Practice Midwife

## 2021-04-07 ENCOUNTER — Ambulatory Visit: Payer: Medicaid Other | Admitting: *Deleted

## 2021-04-07 ENCOUNTER — Inpatient Hospital Stay (HOSPITAL_COMMUNITY)
Admission: AD | Admit: 2021-04-07 | Discharge: 2021-04-09 | DRG: 807 | Disposition: A | Discharge status: Home / Self Care | Payer: Medicaid Other | Attending: Obstetrics and Gynecology | Admitting: Obstetrics and Gynecology

## 2021-04-07 ENCOUNTER — Encounter (HOSPITAL_COMMUNITY): Payer: Self-pay | Admitting: Obstetrics & Gynecology

## 2021-04-07 ENCOUNTER — Ambulatory Visit (HOSPITAL_BASED_OUTPATIENT_CLINIC_OR_DEPARTMENT_OTHER): Payer: Medicaid Other

## 2021-04-07 VITALS — BP 114/73 | HR 61

## 2021-04-07 DIAGNOSIS — O4292 Full-term premature rupture of membranes, unspecified as to length of time between rupture and onset of labor: Secondary | ICD-10-CM | POA: Diagnosis present

## 2021-04-07 DIAGNOSIS — O09523 Supervision of elderly multigravida, third trimester: Secondary | ICD-10-CM | POA: Diagnosis present

## 2021-04-07 DIAGNOSIS — O0933 Supervision of pregnancy with insufficient antenatal care, third trimester: Secondary | ICD-10-CM | POA: Diagnosis not present

## 2021-04-07 DIAGNOSIS — O24424 Gestational diabetes mellitus in childbirth, insulin controlled: Secondary | ICD-10-CM | POA: Diagnosis not present

## 2021-04-07 DIAGNOSIS — O24425 Gestational diabetes mellitus in childbirth, controlled by oral hypoglycemic drugs: Secondary | ICD-10-CM | POA: Diagnosis present

## 2021-04-07 DIAGNOSIS — Z3A38 38 weeks gestation of pregnancy: Secondary | ICD-10-CM | POA: Diagnosis not present

## 2021-04-07 DIAGNOSIS — O24415 Gestational diabetes mellitus in pregnancy, controlled by oral hypoglycemic drugs: Secondary | ICD-10-CM

## 2021-04-07 DIAGNOSIS — O99214 Obesity complicating childbirth: Secondary | ICD-10-CM | POA: Diagnosis present

## 2021-04-07 DIAGNOSIS — Z20822 Contact with and (suspected) exposure to covid-19: Secondary | ICD-10-CM | POA: Diagnosis present

## 2021-04-07 DIAGNOSIS — O26893 Other specified pregnancy related conditions, third trimester: Secondary | ICD-10-CM | POA: Diagnosis present

## 2021-04-07 DIAGNOSIS — O4202 Full-term premature rupture of membranes, onset of labor within 24 hours of rupture: Secondary | ICD-10-CM | POA: Diagnosis not present

## 2021-04-07 DIAGNOSIS — O24419 Gestational diabetes mellitus in pregnancy, unspecified control: Secondary | ICD-10-CM

## 2021-04-07 LAB — POCT FERN TEST: POCT Fern Test: POSITIVE

## 2021-04-07 MED ORDER — METFORMIN HCL 500 MG PO TABS
500.0000 mg | ORAL_TABLET | Freq: Two times a day (BID) | ORAL | Status: DC
  Filled 2021-04-07: qty 1

## 2021-04-07 NOTE — MAU Note (Signed)
Leaking clear fld since 2215. No pain but feels tightening when baby balls up. No recent sve. Good FM. GDM on Metformin

## 2021-04-07 NOTE — MAU Provider Note (Addendum)
Pt informed that the ultrasound is considered a limited OB ultrasound and is not intended to be a complete ultrasound exam.  Patient also informed that the ultrasound is not being completed with the intent of assessing for fetal or placental anomalies or any pelvic abnormalities.  Explained that the purpose of todays ultrasound is to assess for presentation  Patient acknowledges the purpose of the exam and the limitations of the study.    Fetus is found to be in the vertex presentation  FHR 130s, average variability + accelerations No decelerations Irregular contractions Category 1 tracing  Aviva Signs, CNM

## 2021-04-07 NOTE — H&P (Addendum)
OB ADMISSION/ HISTORY & PHYSICAL:  Admission Date: 04/07/2021 10:54 PM  Admit Diagnosis: Water broke    Kenzee Bassin is a 41 y.o. female at 82w3dpresenting for SROM at 2215, clear fluid. Patient reports positive fetal movement, denies vaginal bleeding, HA, vision changes, and RUQ pain. Expecting baby boy EFW 95% (4011g).   Prenatal History: [id number]   EDC : 04/18/2021, by Last Menstrual Period  [facility]  Prenatal course complicated by:  -[id number] Metformin  -AMA  Prenatal Labs: Nursing Staff Provider  Office Location [facility]  Dating  LMP: 07/12/20 EDD: 04/18/2021  Language  English Anatomy [place]    Flu Vaccine  Declined 01/13/21 Genetic Screen  NIPS: WNL   AFP:   First Screen:  Quad:     TDaP Vaccine   01/26/21 Hgb A1C or  GTT Early  Third trimester   COVID Vaccine     LAB RESULTS   Rhogam  N/A Blood Type B/Positive/-- (08/26 0000) B Positive  Feeding Plan bottle Antibody Negative (08/26 0000)Negative  Contraception DEPO Rubella  Immune  Circumcision YES RPR Non Reactive (11/22 0933)Non Reactive  Pediatrician  UNSURE- list given 03/09/21 HBsAg negative (08/26 0000)   Support Person  FOB HCVAb Non Reactive  Prenatal Classes   HIV Non Reactive (11/22 0933)     BTL Consent   GBS   (For PCN allergy, check sensitivities)   VBAC Consent   Pap        Hgb Electro  Negative  BP Cuff/SCALE RX 01/13/21 CF        SMA    PHQ-9/GAD-7  [X]  NOB Waterbirth  [ ]  Class [ ]  Consent [ ]  CNM visit     [X]   36 WEEKS Induction  [ ]  Orders Entered [ ] Foley Y/N      Maternal Diabetes: Yes:  Diabetes Type:  Insulin/Medication controlled Genetic Screening: Normal Maternal Ultrasounds/Referrals: Normal Fetal Ultrasounds or other Referrals:  None Maternal Substance Abuse:  No Significant Maternal Medications:  None Significant Maternal Lab Results:  Group B Strep negative Other Comments:  None  Medical / Surgical History : Past medical history:  Past Medical History:  Diagnosis Date    Vaginal Pap smear, abnormal     Past surgical history:  Past Surgical History:  Procedure Laterality Date   NO PAST SURGERIES      Family History:  Family History  Problem Relation Age of Onset   Asthma Daughter     Social History:  reports that she has never smoked. She has never used smokeless tobacco. She reports that she does not currently use alcohol. She reports that she does not use drugs. Allergies: Patient has no known allergies.   Current Medications at time of admission:  Medications Prior to Admission  Medication Sig Dispense Refill Last Dose   docusate sodium (COLACE) 100 MG capsule Take 1 capsule (100 mg total) by mouth 2 (two) times daily. 60 capsule 11 04/06/2021   metFORMIN (GLUCOPHAGE) 500 MG tablet Take 1 tablet (500 mg total) by mouth 2 (two) times daily with a meal. 60 tablet 2 04/07/2021   Prenatal Vit-Fe Fumarate-FA (PRENATAL MULTIVITAMIN) TABS tablet Take 1 tablet by mouth daily at 12 noon.   04/07/2021   Accu-Chek Softclix Lancets lancets Use as instructed 100 each 12    Blood Glucose Monitoring Suppl (ACCU-CHEK GUIDE) w/Device KIT 1 Device by Does not apply route 4 (four) times daily. 1 kit 0    glucose blood (ACCU-CHEK GUIDE) test  strip Use to check blood sugars four times a day was instructed 50 each 12    hydrocortisone (ANUSOL-HC) 25 MG suppository Place 1 suppository (25 mg total) rectally 2 (two) times daily. (Patient not taking: Reported on 04/07/2021) 24 suppository 11      Review of Systems: Review of Systems  Constitutional: Negative.   HENT: Negative.    Eyes: Negative.   Respiratory: Negative.    Cardiovascular: Negative.   Neurological: Negative.   Psychiatric/Behavioral: Negative.     Physical Exam: Vital signs and nursing notes reviewed.  Patient Vitals for the past 24 hrs:  BP Temp Pulse Resp SpO2 Height Weight  04/07/21 2311 113/71 -- -- -- -- -- --  04/07/21 2310 -- 97.9 F (36.6 C) 66 17 99 % 5' 7"  (1.702 m) 102.5 kg      General: AAO x 3, NAD Heart: RRR Lungs:CTAB Abdomen: Gravid, NT Extremities: no edema Genitalia / VE:     FHR: 140 BPM, moderate variability, present accels, no decels TOCO: Contractions   Labs:   Pending T&S, CBC, RPR  No results for input(s): WBC, HGB, HCT, PLT in the last 72 hours.  Assessment:  41 y.o. G4P3003 at 53w3dSROM at 2215, clear fluid.  1. 1st stage of labor, SROM fern positive in MAU  2. FHR category 1 3. GBS negative 4. Desires epidural during labor 5. Plans to bottle feed  Plan:  1. Admit to BS 2. Routine L&D orders 3. Analgesia/anesthesia PRN  4. Expectant management 5. Anticipate NSVB 6. Planning Depo postpartum  7. Circ prior to discharge   AChristiane HaSNM 04/07/2021, 11:37 PM   CNM attestation:  I have seen and examined this patient; I agree with above documentation in the student midwife's note.   LRazia Screwsis a 41y.o. [phone] for SROM @ 2215 when she felt a distinct gush of fluid (had earlier felt some dampness in the office but had a nl AFI at that time). Not really having ctx.  PE: BP 123/78    Pulse 69    Temp 98.2 F (36.8 C) (Oral)    Resp 16    Ht 5' 7"  (1.702 m)    Wt 102.5 kg    LMP 07/12/2020 (Exact Date)    SpO2 99%    BMI 35.40 kg/m  Gen: calm comfortable, NAD Resp: normal effort, no distress Abd: gravid  ROS, labs, PMH reviewed CBG: 102  Plan: -Admit to Labor and Delivery -Expectant management was initially thought for overnight, however there had been cytotec ordered for her future IOL that was given upon arrival. -CBGs q 4h in latent phase -Continue home Metformin 5070mbid -Pitocin prn -Anticipate vag del  KiMyrtis SerNM 04/08/2021, 3:00 AM

## 2021-04-08 ENCOUNTER — Inpatient Hospital Stay (HOSPITAL_COMMUNITY): Payer: Medicaid Other | Admitting: Anesthesiology

## 2021-04-08 ENCOUNTER — Encounter (HOSPITAL_COMMUNITY): Payer: Self-pay | Admitting: Obstetrics and Gynecology

## 2021-04-08 DIAGNOSIS — O24424 Gestational diabetes mellitus in childbirth, insulin controlled: Secondary | ICD-10-CM

## 2021-04-08 DIAGNOSIS — Z3A38 38 weeks gestation of pregnancy: Secondary | ICD-10-CM

## 2021-04-08 DIAGNOSIS — O24419 Gestational diabetes mellitus in pregnancy, unspecified control: Secondary | ICD-10-CM

## 2021-04-08 DIAGNOSIS — O4202 Full-term premature rupture of membranes, onset of labor within 24 hours of rupture: Secondary | ICD-10-CM

## 2021-04-08 LAB — CBC
HCT: 36.7 % (ref 36.0–46.0)
Hemoglobin: 12 g/dL (ref 12.0–15.0)
MCH: 31.7 pg (ref 26.0–34.0)
MCHC: 32.7 g/dL (ref 30.0–36.0)
MCV: 97.1 fL (ref 80.0–100.0)
Platelets: 160 10*3/uL (ref 150–400)
RBC: 3.78 MIL/uL — ABNORMAL LOW (ref 3.87–5.11)
RDW: 13.4 % (ref 11.5–15.5)
WBC: 7.4 10*3/uL (ref 4.0–10.5)
nRBC: 0 % (ref 0.0–0.2)

## 2021-04-08 LAB — GLUCOSE, CAPILLARY
Glucose-Capillary: 102 mg/dL — ABNORMAL HIGH (ref 70–99)
Glucose-Capillary: 96 mg/dL (ref 70–99)

## 2021-04-08 LAB — TYPE AND SCREEN
ABO/RH(D): B POS
Antibody Screen: NEGATIVE

## 2021-04-08 LAB — RESP PANEL BY RT-PCR (FLU A&B, COVID) ARPGX2
Influenza A by PCR: NEGATIVE
Influenza B by PCR: NEGATIVE
SARS Coronavirus 2 by RT PCR: NEGATIVE

## 2021-04-08 LAB — RPR: RPR Ser Ql: NONREACTIVE

## 2021-04-08 MED ORDER — COCONUT OIL OIL: 1.0000 "application " | TOPICAL_OIL | Status: DC | PRN

## 2021-04-08 MED ORDER — ONDANSETRON HCL 4 MG PO TABS: 4.0000 mg | ORAL_TABLET | ORAL | Status: DC | PRN

## 2021-04-08 MED ORDER — BENZOCAINE-MENTHOL 20-0.5 % EX AERO
1.0000 "application " | INHALATION_SPRAY | CUTANEOUS | Status: DC | PRN
  Administered 2021-04-08: 1 via TOPICAL
  Filled 2021-04-08: qty 56

## 2021-04-08 MED ORDER — DIBUCAINE (PERIANAL) 1 % EX OINT: 1.0000 "application " | TOPICAL_OINTMENT | CUTANEOUS | Status: DC | PRN

## 2021-04-08 MED ORDER — ACETAMINOPHEN 325 MG PO TABS: 650.0000 mg | ORAL_TABLET | ORAL | Status: DC | PRN

## 2021-04-08 MED ORDER — SENNOSIDES-DOCUSATE SODIUM 8.6-50 MG PO TABS
2.0000 | ORAL_TABLET | Freq: Every day | ORAL | Status: DC
  Administered 2021-04-09: 2 via ORAL
  Filled 2021-04-08: qty 2

## 2021-04-08 MED ORDER — DIPHENHYDRAMINE HCL 25 MG PO CAPS: 25.0000 mg | ORAL_CAPSULE | Freq: Four times a day (QID) | ORAL | Status: DC | PRN

## 2021-04-08 MED ORDER — LIDOCAINE-EPINEPHRINE (PF) 2 %-1:200000 IJ SOLN
INTRAMUSCULAR | Status: DC | PRN
  Administered 2021-04-08: 5 mL via EPIDURAL

## 2021-04-08 MED ORDER — EPHEDRINE 5 MG/ML INJ: 10.0000 mg | INTRAVENOUS | Status: DC | PRN

## 2021-04-08 MED ORDER — TERBUTALINE SULFATE 1 MG/ML IJ SOLN: 0.2500 mg | Freq: Once | INTRAMUSCULAR | Status: DC | PRN

## 2021-04-08 MED ORDER — OXYTOCIN BOLUS FROM INFUSION
333.0000 mL | Freq: Once | INTRAVENOUS | Status: AC
  Administered 2021-04-08: 333 mL via INTRAVENOUS

## 2021-04-08 MED ORDER — SOD CITRATE-CITRIC ACID 500-334 MG/5ML PO SOLN: 30.0000 mL | ORAL | Status: DC | PRN

## 2021-04-08 MED ORDER — SIMETHICONE 80 MG PO CHEW: 80.0000 mg | CHEWABLE_TABLET | ORAL | Status: DC | PRN

## 2021-04-08 MED ORDER — FENTANYL CITRATE (PF) 100 MCG/2ML IJ SOLN
100.0000 ug | INTRAMUSCULAR | Status: DC | PRN
  Administered 2021-04-08: 100 ug via INTRAVENOUS
  Filled 2021-04-08: qty 2

## 2021-04-08 MED ORDER — LACTATED RINGERS IV SOLN: INTRAVENOUS | Status: DC

## 2021-04-08 MED ORDER — MEDROXYPROGESTERONE ACETATE 150 MG/ML IM SUSP
150.0000 mg | Freq: Once | INTRAMUSCULAR | Status: AC
  Administered 2021-04-09: 150 mg via INTRAMUSCULAR
  Filled 2021-04-08: qty 1

## 2021-04-08 MED ORDER — FENTANYL-BUPIVACAINE-NACL 0.5-0.125-0.9 MG/250ML-% EP SOLN
12.0000 mL/h | EPIDURAL | Status: DC | PRN
  Administered 2021-04-08: 12 mL/h via EPIDURAL
  Filled 2021-04-08: qty 250

## 2021-04-08 MED ORDER — PHENYLEPHRINE 40 MCG/ML (10ML) SYRINGE FOR IV PUSH (FOR BLOOD PRESSURE SUPPORT): 80.0000 ug | PREFILLED_SYRINGE | INTRAVENOUS | Status: DC | PRN

## 2021-04-08 MED ORDER — ONDANSETRON HCL 4 MG/2ML IJ SOLN: 4.0000 mg | INTRAMUSCULAR | Status: DC | PRN

## 2021-04-08 MED ORDER — WITCH HAZEL-GLYCERIN EX PADS: 1.0000 "application " | MEDICATED_PAD | CUTANEOUS | Status: DC | PRN

## 2021-04-08 MED ORDER — ONDANSETRON HCL 4 MG/2ML IJ SOLN: 4.0000 mg | Freq: Four times a day (QID) | INTRAMUSCULAR | Status: DC | PRN

## 2021-04-08 MED ORDER — ZOLPIDEM TARTRATE 5 MG PO TABS: 5.0000 mg | ORAL_TABLET | Freq: Every evening | ORAL | Status: DC | PRN

## 2021-04-08 MED ORDER — LIDOCAINE HCL (PF) 1 % IJ SOLN: 30.0000 mL | INTRAMUSCULAR | Status: DC | PRN

## 2021-04-08 MED ORDER — OXYCODONE-ACETAMINOPHEN 5-325 MG PO TABS: 2.0000 | ORAL_TABLET | ORAL | Status: DC | PRN

## 2021-04-08 MED ORDER — MISOPROSTOL 25 MCG QUARTER TABLET
25.0000 ug | ORAL_TABLET | ORAL | Status: DC | PRN
  Administered 2021-04-08: 25 ug via VAGINAL
  Filled 2021-04-08: qty 1

## 2021-04-08 MED ORDER — OXYCODONE HCL 5 MG PO TABS: 5.0000 mg | ORAL_TABLET | ORAL | Status: DC | PRN

## 2021-04-08 MED ORDER — PRENATAL MULTIVITAMIN CH
1.0000 | ORAL_TABLET | Freq: Every day | ORAL | Status: DC
  Administered 2021-04-08 – 2021-04-09 (×2): 1 via ORAL
  Filled 2021-04-08 (×2): qty 1

## 2021-04-08 MED ORDER — METHYLERGONOVINE MALEATE 0.2 MG PO TABS
0.2000 mg | ORAL_TABLET | Freq: Four times a day (QID) | ORAL | Status: DC
  Administered 2021-04-08 – 2021-04-09 (×5): 0.2 mg via ORAL
  Filled 2021-04-08 (×5): qty 1

## 2021-04-08 MED ORDER — OXYTOCIN-SODIUM CHLORIDE 30-0.9 UT/500ML-% IV SOLN
2.5000 [IU]/h | INTRAVENOUS | Status: DC
  Administered 2021-04-08: 2.5 [IU]/h via INTRAVENOUS
  Filled 2021-04-08: qty 500

## 2021-04-08 MED ORDER — METHYLERGONOVINE MALEATE 0.2 MG PO TABS: 0.2000 mg | ORAL_TABLET | ORAL | Status: DC | PRN

## 2021-04-08 MED ORDER — OXYCODONE-ACETAMINOPHEN 5-325 MG PO TABS: 1.0000 | ORAL_TABLET | ORAL | Status: DC | PRN

## 2021-04-08 MED ORDER — DIPHENHYDRAMINE HCL 50 MG/ML IJ SOLN: 12.5000 mg | INTRAMUSCULAR | Status: DC | PRN

## 2021-04-08 MED ORDER — LACTATED RINGERS IV SOLN: 500.0000 mL | INTRAVENOUS | Status: DC | PRN

## 2021-04-08 MED ORDER — METHYLERGONOVINE MALEATE 0.2 MG/ML IJ SOLN: 0.2000 mg | INTRAMUSCULAR | Status: DC | PRN

## 2021-04-08 MED ORDER — OXYTOCIN-SODIUM CHLORIDE 30-0.9 UT/500ML-% IV SOLN: 1.0000 m[IU]/min | INTRAVENOUS | Status: DC

## 2021-04-08 MED ORDER — IBUPROFEN 600 MG PO TABS
600.0000 mg | ORAL_TABLET | Freq: Four times a day (QID) | ORAL | Status: DC
  Administered 2021-04-08 – 2021-04-09 (×5): 600 mg via ORAL
  Filled 2021-04-08 (×5): qty 1

## 2021-04-08 MED ORDER — TETANUS-DIPHTH-ACELL PERTUSSIS 5-2.5-18.5 LF-MCG/0.5 IM SUSY: 0.5000 mL | PREFILLED_SYRINGE | Freq: Once | INTRAMUSCULAR | Status: DC

## 2021-04-08 MED ORDER — LACTATED RINGERS IV SOLN
500.0000 mL | Freq: Once | INTRAVENOUS | Status: AC
  Administered 2021-04-08: 500 mL via INTRAVENOUS

## 2021-04-08 NOTE — Discharge Summary (Addendum)
Postpartum Discharge Summary    Patient Name: Julie Montoya DOB: Oct 05, 1980 MRN: [id number]  Date of admission: 04/07/2021 Delivery date:04/08/2021  Delivering provider: WHITE, Ubaldo Glassing D  Date of discharge: 04/09/2021  Admitting diagnosis: GDM, class A2 [O24.419] Intrauterine pregnancy: [redacted]w[redacted]d    Secondary diagnosis:  Principal Problem:   GDM, class A2 Active Problems:   AMA (advanced maternal age) multigravida 348+ third trimester  Additional problems: none    Discharge diagnosis: Term Pregnancy Delivered and GDM A2                                              Post partum procedures: none Augmentation: Cytotec Complications: manual placental extraction  Hospital course: Induction of Labor With Vaginal Delivery   41y.o. yo [id number] 390w4das admitted to the hospital 04/07/2021 for induction of labor.  Indication for induction: PROM.  Patient had an uncomplicated labor course, receiving a dose of cytotec and then progressing to active labor followed by epidural placement. She required a manual placental extraction as it was >3010m post delivery and still had an adherent edge (see delivery summary). Membrane Rupture Time/Date: 10:15 PM ,04/07/2021   Delivery Method:Vaginal, Spontaneous  Episiotomy: None  Lacerations:  None  Details of delivery can be found in separate delivery note.  Patient had a routine postpartum course. Patient is discharged home 04/09/21.  Newborn Data: Birth date:04/08/2021  Birth time:6:42 AM  Gender:Female  Living status:Living  Apgars:8 ,9  Weight:3960 g (8lb 11.7oz)  Magnesium Sulfate received: No BMZ received: No Rhophylac:N/A MMR:N/A T-DaP:Given prenatally Flu: No Transfusion:No  Physical exam  Vitals:   04/08/21 1807 04/08/21 2130 04/09/21 0525 04/09/21 0837  BP: 117/72 115/60 108/70 114/76  Pulse: 72 73 64 66  Resp: _0 Temp: 98.9 F (37.2 C) 98 F (36.7 C) 98 F (36.7 C) 98.2 F (36.8 C)  TempSrc: Oral Oral Oral Oral   SpO2:  99% 100% 98%  Weight:      Height:       General: alert, cooperative, and no distress Lochia: appropriate Uterine Fundus: firm Incision: N/A DVT Evaluation: No cords or calf tenderness. No significant calf/ankle edema. Labs: Lab Results  Component Value Date   WBC 7.4 04/07/2021   HGB 12.0 04/07/2021   HCT 36.7 04/07/2021   MCV 97.1 04/07/2021   PLT 160 04/07/2021   No flowsheet data found. Edinburgh Score: Edinburgh Postnatal Depression Scale Screening Tool 04/08/2021  I have been able to laugh and see the funny side of things. 0  I have looked forward with enjoyment to things. 0  I have blamed myself unnecessarily when things went wrong. 0  I have been anxious or worried for no good reason. 0  I have felt scared or panicky for no good reason. 0  Things have been getting on top of me. 1  I have been so unhappy that I have had difficulty sleeping. 0  I have felt sad or miserable. 0  I have been so unhappy that I have been crying. 0  The thought of harming myself has occurred to me. 0  Edinburgh Postnatal Depression Scale Total 1     After visit meds:  Allergies as of 04/09/2021   Not on File      Medication List     STOP taking these medications    Accu-Chek  Guide test strip Generic drug: glucose blood   Accu-Chek Guide w/Device Kit   Accu-Chek Softclix Lancets lancets   docusate sodium 100 MG capsule Commonly known as: Colace   metFORMIN 500 MG tablet Commonly known as: Glucophage       TAKE these medications    acetaminophen 325 MG tablet Commonly known as: Tylenol Take 2 tablets (650 mg total) by mouth every 4 (four) hours as needed (for pain scale < 4).   ibuprofen 600 MG tablet Commonly known as: ADVIL Take 1 tablet (600 mg total) by mouth every 6 (six) hours.   prenatal multivitamin Tabs tablet Take 1 tablet by mouth daily at 12 noon.         Discharge home in stable condition Infant Feeding: Bottle Infant Disposition:home  with mother Discharge instruction: per After Visit Summary and Postpartum booklet. Activity: Advance as tolerated. Pelvic rest for 6 weeks.  Diet: routine diet Future Appointments: Future Appointments  Date Time Provider [place]  05/21/2021  9:00 AM [facility] [place] None  05/21/2021  9:55 AM Woodroe Mode, MD [place] None    Follow up Visit:  Myrtis Ser, CNM  P [facility] Please needs Postpartum visit in: 6 weeks with the following provider: Any provider  High risk pregnancy complicated by: GDMA2  Delivery mode:  SVD  Anticipated Birth Control:  Depo  PP Procedures needed: 2 hour GTT  Schedule Integrated BH visit: no    04/09/2021 [name], DO  GME ATTESTATION:  I saw and evaluated the patient. I agree with the findings and the plan of care as documented in the residents note.  Darrelyn Hillock, DO OB Fellow, [place] for [place] 04/09/2021 10:10 AM

## 2021-04-08 NOTE — Anesthesia Preprocedure Evaluation (Signed)
Anesthesia Evaluation  Patient identified by MRN, date of birth, ID band Patient awake    Reviewed: Allergy & Precautions, NPO status , Patient's Chart, lab work & pertinent test results  Airway Mallampati: II  TM Distance: >3 FB Neck ROM: Full    Dental no notable dental hx.    Pulmonary neg pulmonary ROS,    Pulmonary exam normal breath sounds clear to auscultation       Cardiovascular negative cardio ROS Normal cardiovascular exam Rhythm:Regular Rate:Normal     Neuro/Psych negative neurological ROS  negative psych ROS   GI/Hepatic negative GI ROS, Neg liver ROS,   Endo/Other  diabetes, GestationalObese BMI 35  Renal/GU negative Renal ROS  negative genitourinary   Musculoskeletal negative musculoskeletal ROS (+)   Abdominal (+) + obese,   Peds  Hematology negative hematology ROS (+)   Anesthesia Other Findings   Reproductive/Obstetrics (+) Pregnancy                             Anesthesia Physical Anesthesia Plan  ASA: 3  Anesthesia Plan: Epidural   Post-op Pain Management:    Induction:   PONV Risk Score and Plan: Treatment may vary due to age or medical condition  Airway Management Planned: Natural Airway  Additional Equipment:   Intra-op Plan:   Post-operative Plan:   Informed Consent: I have reviewed the patients History and Physical, chart, labs and discussed the procedure including the risks, benefits and alternatives for the proposed anesthesia with the patient or authorized representative who has indicated his/her understanding and acceptance.       Plan Discussed with: Anesthesiologist  Anesthesia Plan Comments: (Patient identified. Risks, benefits, options discussed with patient including but not limited to bleeding, infection, nerve damage, paralysis, failed block, incomplete pain control, headache, blood pressure changes, nausea, vomiting, reactions to  medication, itching, and post partum back pain. Confirmed with bedside nurse the patient's most recent platelet count. Confirmed with the patient that they are not taking any anticoagulation, have any bleeding history or any family history of bleeding disorders. Patient expressed understanding and wishes to proceed. All questions were answered. )        Anesthesia Quick Evaluation

## 2021-04-08 NOTE — Anesthesia Postprocedure Evaluation (Signed)
Anesthesia Post Note  Patient: Julie Montoya  Procedure(s) Performed: AN AD HOC LABOR EPIDURAL     Patient location during evaluation: Mother Baby Anesthesia Type: Epidural Level of consciousness: awake and alert Pain management: pain level controlled Vital Signs Assessment: post-procedure vital signs reviewed and stable Respiratory status: spontaneous breathing, nonlabored ventilation and respiratory function stable Cardiovascular status: stable Postop Assessment: no headache, no backache and epidural receding Anesthetic complications: no   No notable events documented.  Last Vitals:  Vitals:   04/08/21 1400 04/08/21 1807  BP: 109/80 117/72  Pulse: (!) 53 72  Resp: 18 18  Temp: 36.8 C 37.2 C  SpO2:      Last Pain:  Vitals:   04/08/21 1807  TempSrc: Oral  PainSc:    Pain Goal:                   [name]

## 2021-04-08 NOTE — Anesthesia Procedure Notes (Signed)
Epidural Patient location during procedure: OB Start time: 04/08/2021 5:30 AM End time: 04/08/2021 5:45 AM  Staffing Anesthesiologist: Freddrick March, MD Performed: anesthesiologist   Preanesthetic Checklist Completed: patient identified, IV checked, risks and benefits discussed, monitors and equipment checked, pre-op evaluation and timeout performed  Epidural Patient position: sitting Prep: DuraPrep and site prepped and draped Patient monitoring: continuous pulse ox, blood pressure, heart rate and cardiac monitor Approach: midline Location: L3-L4 Injection technique: LOR air  Needle:  Needle type: Tuohy  Needle gauge: 17 G Needle length: 9 cm Needle insertion depth: 7 cm Catheter type: closed end flexible Catheter size: 19 Gauge Catheter at skin depth: 12 cm Test dose: negative  Assessment Sensory level: T8 Events: blood not aspirated, injection not painful, no injection resistance, no paresthesia and negative IV test  Additional Notes Patient identified. Risks/Benefits/Options discussed with patient including but not limited to bleeding, infection, nerve damage, paralysis, failed block, incomplete pain control, headache, blood pressure changes, nausea, vomiting, reactions to medication both or allergic, itching and postpartum back pain. Confirmed with bedside nurse the patient's most recent platelet count. Confirmed with patient that they are not currently taking any anticoagulation, have any bleeding history or any family history of bleeding disorders. Patient expressed understanding and wished to proceed. All questions were answered. Sterile technique was used throughout the entire procedure. Please see nursing notes for vital signs. Test dose was given through epidural catheter and negative prior to continuing to dose epidural or start infusion. Warning signs of high block given to the patient including shortness of breath, tingling/numbness in hands, complete motor block, or  any concerning symptoms with instructions to call for help. Patient was given instructions on fall risk and not to get out of bed. All questions and concerns addressed with instructions to call with any issues or inadequate analgesia.    1st attempt at L3/4 with aspiration of blood. Catheter removed. 2nd attempt at L3/4 with negative aspiration. Reason for block:procedure for pain

## 2021-04-08 NOTE — Progress Notes (Signed)
S: Patient hurting with contractions now. Significant other Cedric at bedside and supportive. Patient has no questions/concerns right now.   O: Vitals:   04/07/21 2332 04/08/21 0002 04/08/21 0150 04/08/21 0300  BP: 114/78 135/74 123/78 108/64  Pulse: 73 66 69 66  Resp: 17 16 16 16   Temp:  98.2 F (36.8 C) 98.2 F (36.8 C)   TempSrc:  Oral Oral   SpO2:      Weight:      Height:        FHT:  FHR: 135 bpm, variability: moderate,  accelerations:  Present,  decelerations:  Absent UC:   irregular, every 2-4.5 minutes SVE:   Dilation: 3.5 Effacement (%): 60, 70 Station: -3 Exam by:: Julie Montoya SNM  A / P: 41 y.o. G4P3003 [redacted]w[redacted]d SROM at 2215 progressing after 1 dose of Cytotec at 0045 -Discussed expectant management vs Pitocin and patient wants Pitocin -GBS negative  -CBG Q 4hrs in latent labor Fetal Wellbeing:  Category I Pain Control:  Labor support without medications Anticipated MOD:  NSVD  Julie Montoya, SNM 04/08/2021, 4:55 AM

## 2021-04-09 ENCOUNTER — Encounter: Payer: Medicaid Other | Admitting: Obstetrics and Gynecology

## 2021-04-09 ENCOUNTER — Other Ambulatory Visit (HOSPITAL_COMMUNITY): Payer: Self-pay

## 2021-04-09 LAB — GLUCOSE, CAPILLARY: Glucose-Capillary: 94 mg/dL (ref 70–99)

## 2021-04-09 MED ORDER — ACETAMINOPHEN 325 MG PO TABS: 650.0000 mg | ORAL_TABLET | ORAL | Status: AC | PRN

## 2021-04-09 MED ORDER — IBUPROFEN 600 MG PO TABS
600.0000 mg | ORAL_TABLET | Freq: Four times a day (QID) | ORAL | 0 refills | Status: AC
Start: 2021-04-09 — End: ?
  Filled 2021-04-09: qty 60, 15d supply, fill #0

## 2021-04-11 ENCOUNTER — Inpatient Hospital Stay (HOSPITAL_COMMUNITY): Payer: Medicaid Other

## 2021-04-11 ENCOUNTER — Inpatient Hospital Stay (HOSPITAL_COMMUNITY)
Admission: AD | Admit: 2021-04-11 | Payer: Medicaid Other | Source: Home / Self Care | Admitting: Obstetrics & Gynecology

## 2021-04-12 ENCOUNTER — Encounter: Payer: Self-pay | Admitting: Advanced Practice Midwife

## 2021-04-12 LAB — SURGICAL PATHOLOGY

## 2021-04-19 ENCOUNTER — Telehealth: Payer: Self-pay | Admitting: *Deleted

## 2021-04-19 NOTE — Telephone Encounter (Signed)
TC to notify of completed FMLA forms for spouse. Patient will pick up and pay for fee.

## 2021-04-20 ENCOUNTER — Telehealth (HOSPITAL_COMMUNITY): Payer: Self-pay | Admitting: *Deleted

## 2021-04-20 NOTE — Telephone Encounter (Signed)
Mom reports feeling good. No concerns about herself at this time. EPDS=0(Hospital score=1) Mom reports baby is doing well. Feeding, peeing, and pooping without difficulty. Safe sleep reviewed. Mom reports no concerns about baby at present.  Duffy Rhody, RN 04-20-2021 at 2:55pm

## 2021-04-20 NOTE — Telephone Encounter (Signed)
Phone voicemail message left to return nurse call.  Duffy Rhody, RN 04-20-2021 at 2:39pm

## 2021-05-21 ENCOUNTER — Other Ambulatory Visit: Payer: Self-pay

## 2021-05-21 ENCOUNTER — Ambulatory Visit (INDEPENDENT_AMBULATORY_CARE_PROVIDER_SITE_OTHER): Payer: Medicaid Other | Admitting: Obstetrics & Gynecology

## 2021-05-21 ENCOUNTER — Other Ambulatory Visit: Payer: Medicaid Other

## 2021-05-21 VITALS — BP 113/73 | HR 72 | Ht 67.0 in | Wt 198.9 lb

## 2021-05-21 DIAGNOSIS — O24415 Gestational diabetes mellitus in pregnancy, controlled by oral hypoglycemic drugs: Secondary | ICD-10-CM

## 2021-05-21 NOTE — Progress Notes (Signed)
? ? ?  Post Partum Visit Note ? ?Julie Montoya is a 41 y.o. G39P4004 female who presents for a postpartum visit. She is 6 week postpartum following a normal spontaneous vaginal delivery.  I have fully reviewed the prenatal and intrapartum course. The delivery was at 39 gestational weeks.  Anesthesia: epidural. Postpartum course has been unremarkable. Baby is doing well. Baby is feeding by bottle - Jerlyn Ly Start Gentle . Bleeding  light bleeding since delivery . Bowel function is normal. Bladder function is normal. Patient is not sexually active. Contraception method is Depo-Provera injections. Postpartum depression screening: negative. ? ? ?The pregnancy intention screening data noted above was reviewed. Potential methods of contraception were discussed. The patient elected to proceed with No data recorded. ? ? ? ?Health Maintenance Due  ?Topic Date Due  ? COVID-19 Vaccine (1) Never done  ? URINE MICROALBUMIN  Never done  ? PAP SMEAR-Modifier  Never done  ? INFLUENZA VACCINE  Never done  ? ? ?The following portions of the patient's history were reviewed and updated as appropriate: allergies, current medications, past family history, past medical history, past social history, past surgical history, and problem list. ? ?Review of Systems ?Light vaginal bleeding ? ?Objective:  ?LMP 07/12/2020 (Exact Date)   ? ?General:  alert, cooperative, and no distress  ? Breasts:  not indicated  ?Lungs:   ?Heart:  regular rate and rhythm  ?Abdomen: soft, non-tender; bowel sounds normal; no masses,  no organomegaly   ?Wound   ?GU exam:  not indicated  ?     ?Assessment:  ? ? There are no diagnoses linked to this encounter. ? ?normal postpartum exam.  ? ?Plan:  ? ?Essential components of care per ACOG recommendations: ? ?1.  Mood and well being: Patient with negative depression screening today. Reviewed local resources for support.  ?- Patient tobacco use? No.   ?- hx of drug use? No.   ? ?2. Infant care and feeding:  ?-Patient  currently breastmilk feeding? No.  ?-Social determinants of health (SDOH) reviewed in EPIC. No concerns ?3. Sexuality, contraception and birth spacing ?- Patient does not want a pregnancy in the next year.  Desired family size is 4 children.  ?- Reviewed reproductive life planning. Reviewed contraceptive methods based on pt preferences and effectiveness.  Patient desired Hormonal Injection today.   ?- Discussed birth spacing of 18 months ? ?4. Sleep and fatigue ?-Encouraged family/partner/community support of 4 hrs of uninterrupted sleep to help with mood and fatigue ? ?5. Physical Recovery  ?- Discussed patients delivery and complications. She describes her labor as good. ?- Patient had a Vaginal, no problems at delivery. Patient had a 1st degree laceration. Perineal healing reviewed. Patient expressed understanding ?- Patient has urinary incontinence? No. ?- Patient is safe to resume physical and sexual activity ? ?6.  Health Maintenance ?- HM due items addressed Yes ?- Last pap smear No results found for: DIAGPAP Pap smear not done at today's visit.  ?-Breast Cancer screening indicated? No.  ? ?7. Chronic Disease/Pregnancy Condition follow up: Gestational Diabetes ? ?- PCP follow up ? ?Woodroe Mode, MD ? ?[facility]  ?

## 2021-05-22 LAB — GLUCOSE TOLERANCE, 2 HOURS
Glucose, 2 hour: 108 mg/dL (ref 70–139)
Glucose, GTT - Fasting: 123 mg/dL — ABNORMAL HIGH (ref 70–99)

## 2021-06-29 ENCOUNTER — Ambulatory Visit (INDEPENDENT_AMBULATORY_CARE_PROVIDER_SITE_OTHER): Payer: Medicaid Other

## 2021-06-29 DIAGNOSIS — Z3042 Encounter for surveillance of injectable contraceptive: Secondary | ICD-10-CM | POA: Diagnosis not present

## 2021-06-29 MED ORDER — MEDROXYPROGESTERONE ACETATE 150 MG/ML IM SUSP: 150.0000 mg | INTRAMUSCULAR | 3 refills | Status: AC

## 2021-06-29 MED ORDER — MEDROXYPROGESTERONE ACETATE 150 MG/ML IM SUSP
150.0000 mg | Freq: Once | INTRAMUSCULAR | Status: AC
  Administered 2021-06-29: 150 mg via INTRAMUSCULAR

## 2021-06-29 NOTE — Progress Notes (Signed)
Patient presents for depo injection. Patient is within her window. Injection given in LUOQ. Patient tolerated well. Next depo due between 7/11-7/25 ? ?Patient would like to continue depo. RX sent for the remainder of the year. Office supply given today. ?

## 2021-09-21 ENCOUNTER — Ambulatory Visit (INDEPENDENT_AMBULATORY_CARE_PROVIDER_SITE_OTHER): Payer: Medicaid Other

## 2021-09-21 DIAGNOSIS — Z3042 Encounter for surveillance of injectable contraceptive: Secondary | ICD-10-CM | POA: Diagnosis not present

## 2021-09-21 MED ORDER — MEDROXYPROGESTERONE ACETATE 150 MG/ML IM SUSP
150.0000 mg | INTRAMUSCULAR | Status: AC
  Administered 2021-09-21 – 2022-03-09 (×2): 150 mg via INTRAMUSCULAR

## 2021-09-21 NOTE — Progress Notes (Signed)
Pt is in the office for depo injection. Administered in RUOQ and pt tolerated well. Next due Oct 3-17. .. Administrations This Visit     medroxyPROGESTERone (DEPO-PROVERA) injection 150 mg     Admin Date 09/21/2021 Action Given Dose 150 mg Route Intramuscular Administered By Katrina Stack, RN

## 2021-12-14 ENCOUNTER — Ambulatory Visit (INDEPENDENT_AMBULATORY_CARE_PROVIDER_SITE_OTHER): Payer: Medicaid Other | Admitting: *Deleted

## 2021-12-14 VITALS — BP 118/78 | HR 76

## 2021-12-14 DIAGNOSIS — Z3042 Encounter for surveillance of injectable contraceptive: Secondary | ICD-10-CM | POA: Diagnosis not present

## 2021-12-14 MED ORDER — MEDROXYPROGESTERONE ACETATE 150 MG/ML IM SUSP
150.0000 mg | Freq: Once | INTRAMUSCULAR | Status: AC
  Administered 2021-12-14: 150 mg via INTRAMUSCULAR

## 2021-12-14 NOTE — Progress Notes (Signed)
Date last pap: 09/23/20, 05/21/21 PP visit. Last Depo-Provera: 09/21/21. Side Effects if any: NA. Serum HCG indicated? NA. Depo-Provera 150 mg IM given by: Lillia Abed. Lazarus Salines, RNC in [employer]. Next appointment due 03/01/22-03/15/22.

## 2022-03-09 ENCOUNTER — Ambulatory Visit (INDEPENDENT_AMBULATORY_CARE_PROVIDER_SITE_OTHER): Payer: Medicaid Other

## 2022-03-09 DIAGNOSIS — Z3042 Encounter for surveillance of injectable contraceptive: Secondary | ICD-10-CM | POA: Diagnosis not present

## 2022-03-09 NOTE — Progress Notes (Signed)
Pt is in the office for depo injection. Administered in RUOQ and pt tolerated well.  Next Due Mar 21- April 4 .. Administrations This Visit     medroxyPROGESTERone (DEPO-PROVERA) injection 150 mg     Admin Date 03/09/2022 Action Given Dose 150 mg Route Intramuscular Administered By Hinton Lovely, RN

## 2022-06-01 ENCOUNTER — Ambulatory Visit: Payer: Medicaid Other

## 2022-12-12 IMAGING — US US MFM FETAL BPP W/O NON-STRESS
1 series · 15 of 26 positions shown · non-contrast
Comparison: none

[Series 1: us mfm fetal bpp w/o non-stress · 26 acquisitions, 15 frames shown]
[im 1/26]
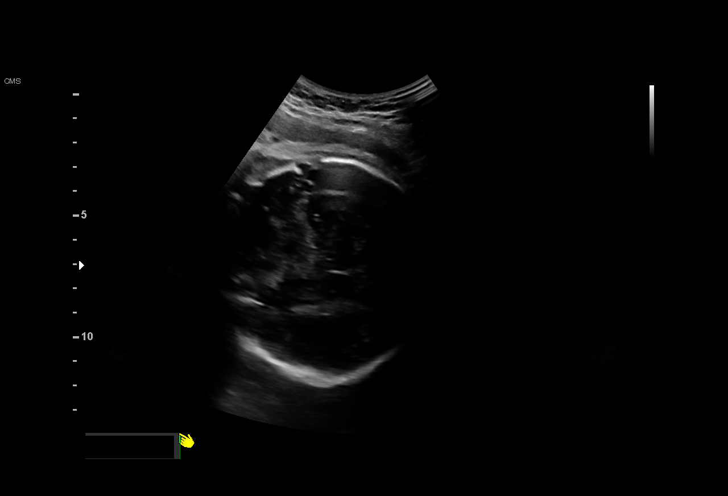
[im 3/26]
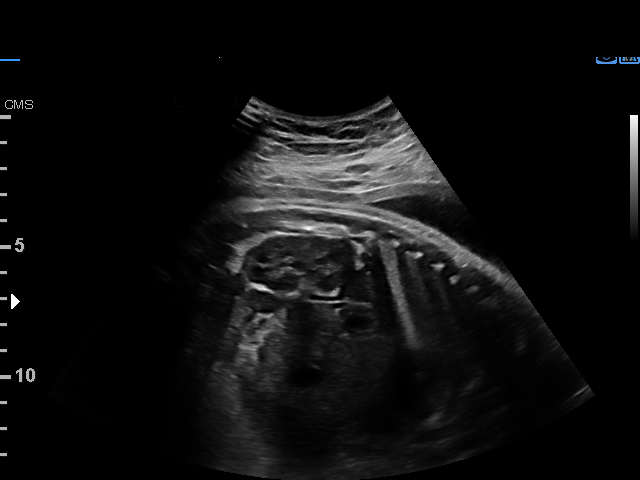
[im 5/26]
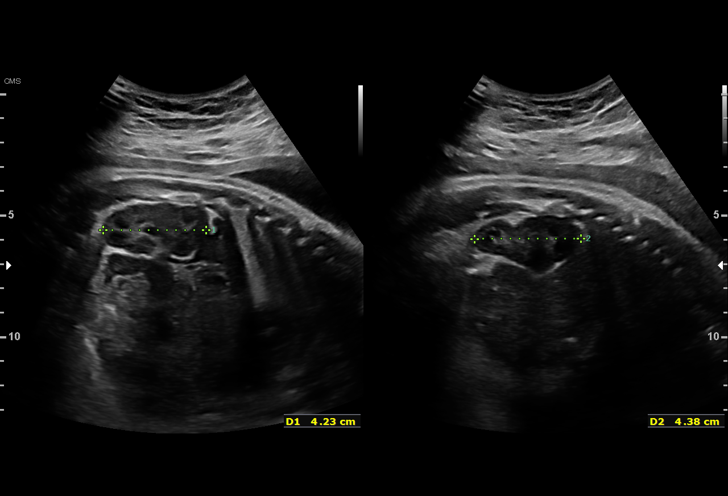
[im 7/26]
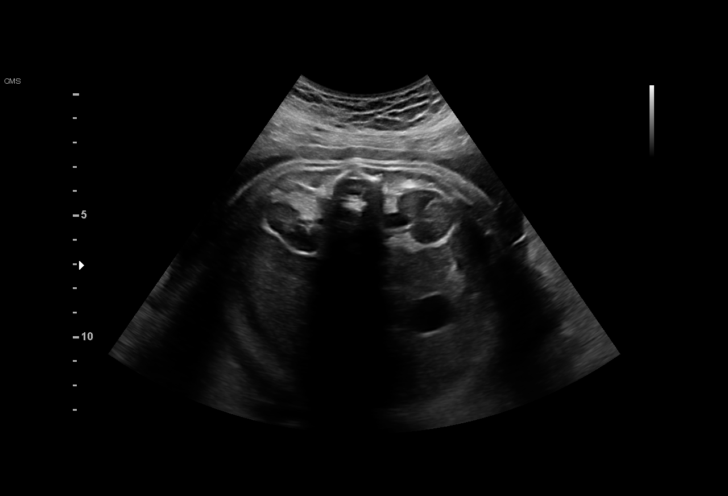
[im 8/26]
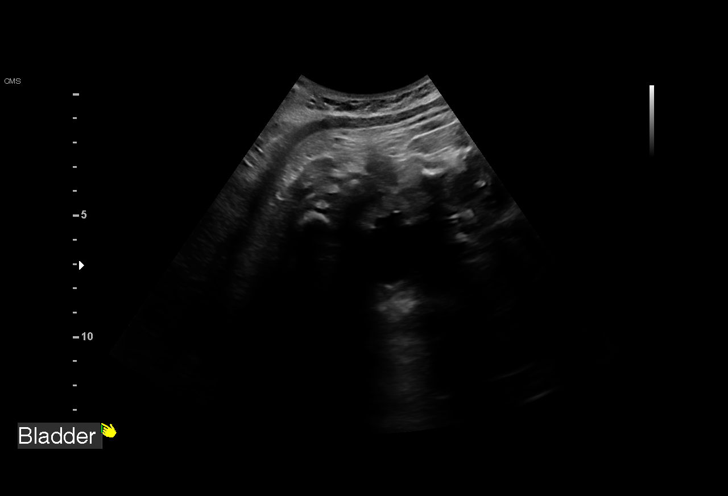
[im 10/26]
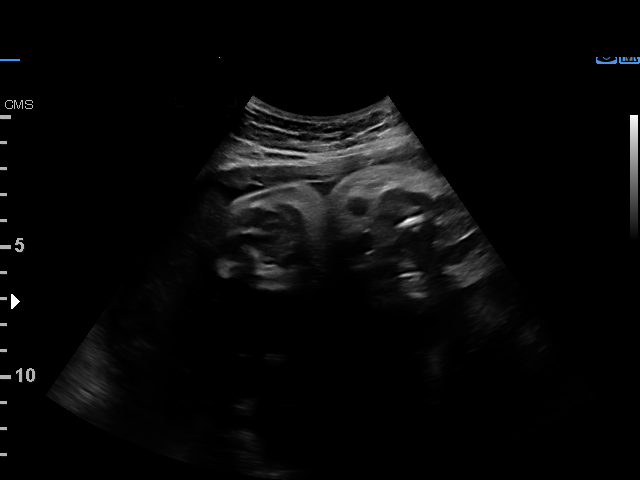
[im 12/26]
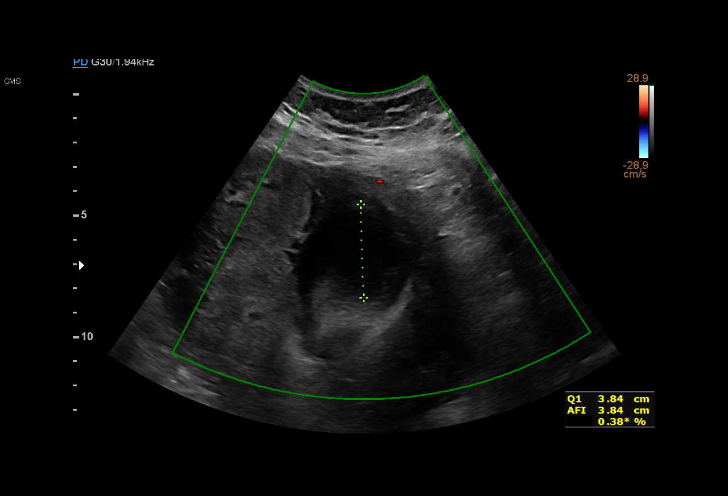
[im 14/26]
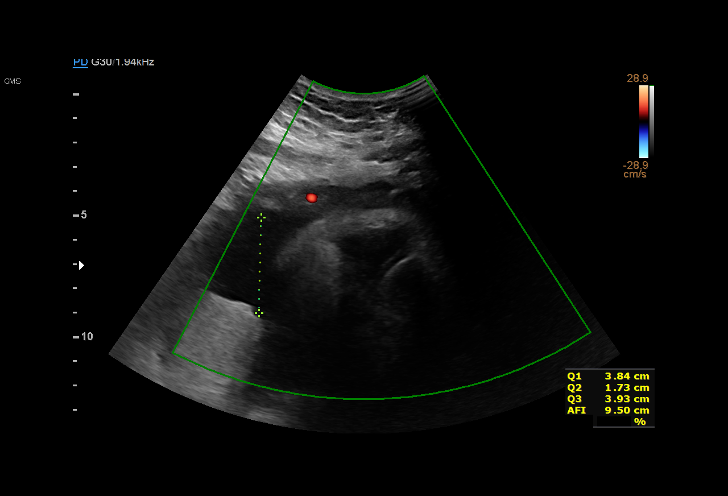
[im 15/26]
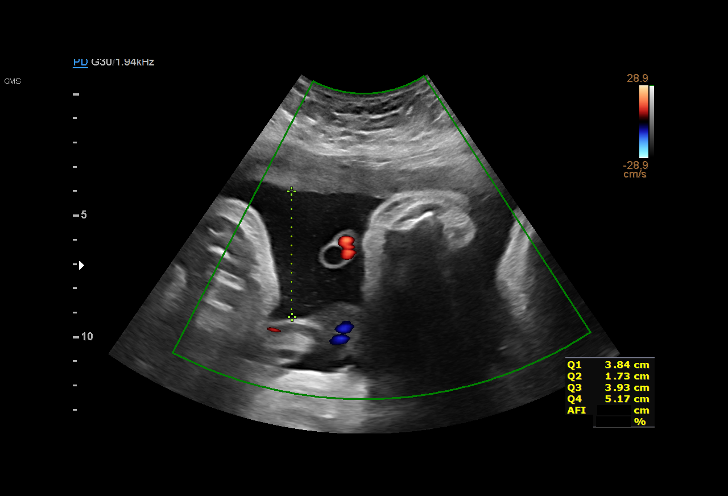
[im 17/26]
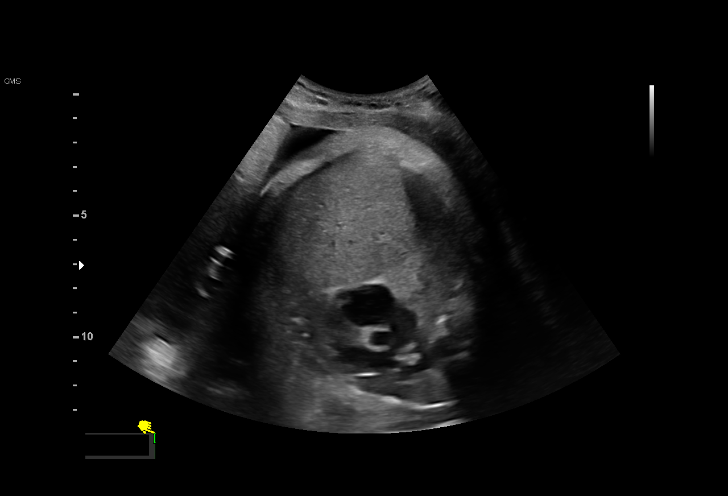
[im 19/26]
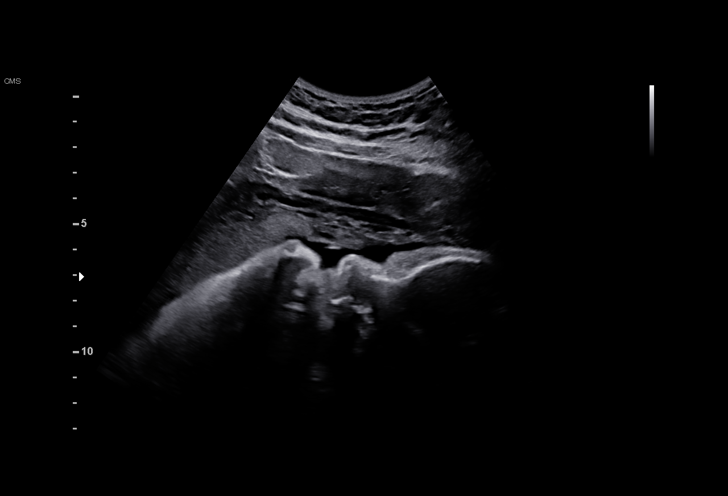
[im 20/26]
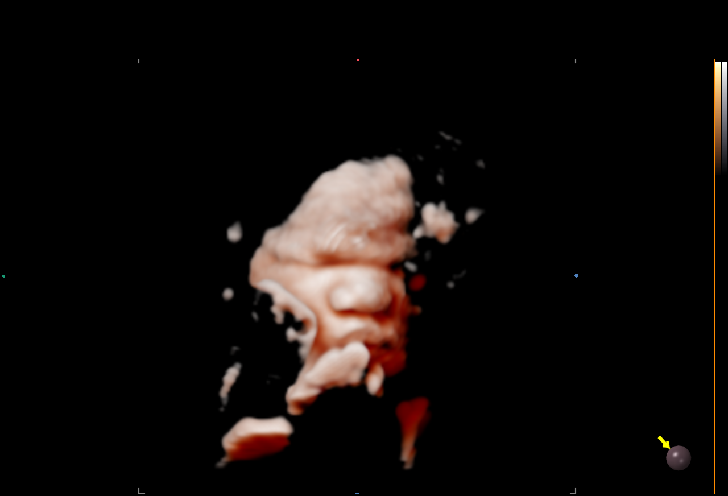
[im 22/26]
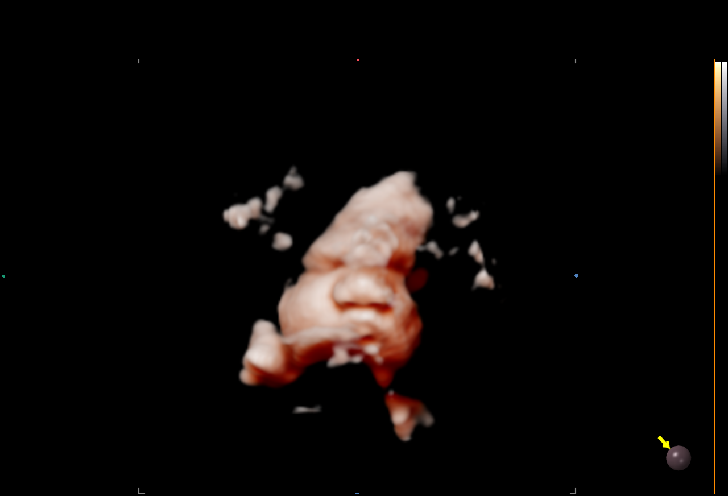
[im 24/26]
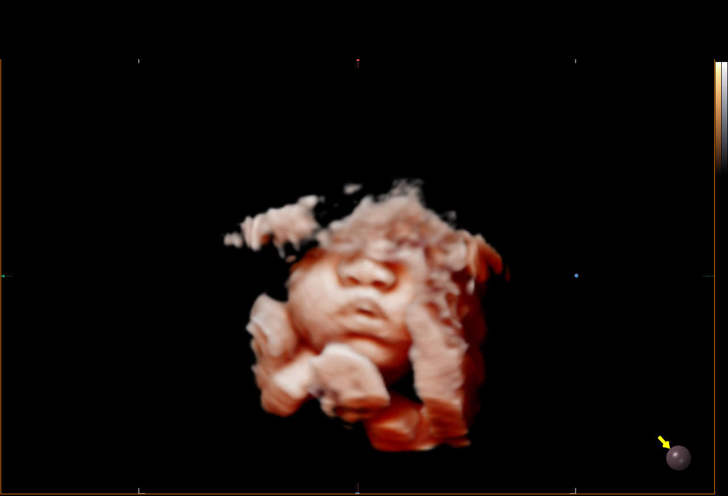
[im 26/26]
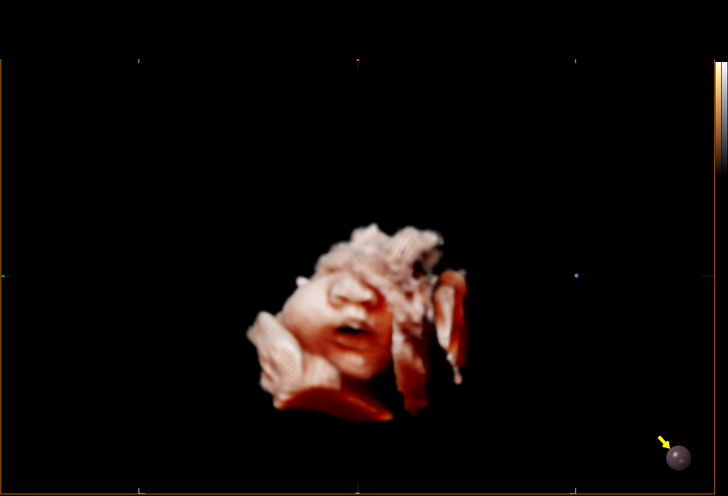

[15 of 26 positions shown; findings below may reference images not displayed]

[REDACTED]
                   [id number]

Indications

 36 weeks gestation of pregnancy
 Advanced maternal age multigravida 35+,
 third trimester
 Gestational diabetes in pregnancy,
 controlled by oral hypoglycemic drugs
 (metformin)
 Insufficient Prenatal Care
Fetal Evaluation

 Num Of Fetuses:         1
 Fetal Heart Rate(bpm):  137
 Cardiac Activity:       Observed
 Presentation:           Cephalic

 Amniotic Fluid
 AFI FV:      Within normal limits

 AFI Sum(cm)     %Tile       Largest Pocket(cm)
 14.6            54

 RUQ(cm)       RLQ(cm)       LUQ(cm)        LLQ(cm)

Biophysical Evaluation

 Amniotic F.V:   Within normal limits       F. Tone:        Observed
 F. Movement:    Observed                   Score:          [DATE]
 F. Breathing:   Observed
OB History

 Blood Type:   B+
 Gravidity:    4         Term:   3        Prem:   0        SAB:   0
 TOP:          0       Ectopic:  0        Living: 3
Gestational Age

 LMP:           36w 3d        Date:  07/12/20                 EDD:   04/18/21
 Best:          36w 3d     Det. By:  LMP  (07/12/20)          EDD:   04/18/21
Impression

 Antenatal testing performed given maternal CU856
 The biophysical profile was [DATE] with good fetal movement and
 amniotic fluid volume.

 She had a providers visit today and increased metformin for
 2hr pp blood sugars.
Recommendations

 Continue weekly testing.
